# Patient Record
Sex: Female | Born: 1960 | Race: Black or African American | Hispanic: No | Marital: Single | Smoking: Former smoker
Health system: Southern US, Community
[De-identification: ages and names within clinical notes are randomized; demographics above are authoritative.]

## PROBLEM LIST (undated history)

## (undated) DIAGNOSIS — R0902 Hypoxemia: Secondary | ICD-10-CM

## (undated) DIAGNOSIS — E669 Obesity, unspecified: Secondary | ICD-10-CM

## (undated) DIAGNOSIS — E785 Hyperlipidemia, unspecified: Secondary | ICD-10-CM

## (undated) DIAGNOSIS — E559 Vitamin D deficiency, unspecified: Secondary | ICD-10-CM

## (undated) DIAGNOSIS — I10 Essential (primary) hypertension: Secondary | ICD-10-CM

## (undated) HISTORY — PX: ABDOMINAL HYSTERECTOMY: SHX81

## (undated) HISTORY — DX: Hypoxemia: R09.02

## (undated) HISTORY — DX: Obesity, unspecified: E66.9

## (undated) HISTORY — DX: Hyperlipidemia, unspecified: E78.5

## (undated) HISTORY — DX: Vitamin D deficiency, unspecified: E55.9

---

## 2000-12-21 HISTORY — PX: MYOMECTOMY: SHX85

## 2003-12-22 HISTORY — PX: ABDOMINAL HYSTERECTOMY: SHX81

## 2006-12-10 ENCOUNTER — Encounter: Admission: RE | Admit: 2006-12-10 | Discharge: 2006-12-10 | Payer: Self-pay | Admitting: Family Medicine

## 2007-06-07 ENCOUNTER — Other Ambulatory Visit: Admission: RE | Admit: 2007-06-07 | Discharge: 2007-06-07 | Payer: Self-pay | Admitting: Family Medicine

## 2008-06-11 ENCOUNTER — Inpatient Hospital Stay (HOSPITAL_COMMUNITY): Admission: RE | Admit: 2008-06-11 | Discharge: 2008-06-13 | Payer: Self-pay | Admitting: Obstetrics & Gynecology

## 2008-06-11 ENCOUNTER — Encounter (INDEPENDENT_AMBULATORY_CARE_PROVIDER_SITE_OTHER): Payer: Self-pay | Admitting: Obstetrics & Gynecology

## 2009-06-17 ENCOUNTER — Encounter: Admission: RE | Admit: 2009-06-17 | Discharge: 2009-06-17 | Payer: Self-pay | Admitting: Family Medicine

## 2011-02-27 ENCOUNTER — Other Ambulatory Visit: Payer: Self-pay | Admitting: Gastroenterology

## 2011-05-05 NOTE — H&P (Signed)
[name], [name]              ACCOUNT NO.:  [id number]   MEDICAL RECORD NO.:  [id number]          PATIENT TYPE:  AMB   LOCATION:  SDC                           FACILITY:  [facility]   PHYSICIAN:  Freddy Finner, M.D.   DATE OF BIRTH:  07-May-1961   DATE OF ADMISSION:  DATE OF DISCHARGE:                              HISTORY & PHYSICAL   ADMISSION DIAGNOSES:  1. Uterine leiomyomata.  2. Cystic left adnexal mass.   HISTORY OF PRESENT ILLNESS:  The patient is a 49 year old female,  gravida 0, who has a long history of uterine leiomyomata and in 2003 had  a myomectomy.  Subsequent to that she had a normal hysterosalpingogram  during the process of an infertility evaluation and treatment during her  previous marriage.  She presented first to my office in October of 2007,  at which time the uterus was felt to be upper normal to bimanual  examination and she had numerous symptoms including intermittent pelvic  pain, premenstrual mood changes.  She had blood testing at that time  which revealed normal FSH and normal TSH, precluding the diagnosis of  menopause.  Since that interval of time she has been followed on several  other occasions during which she had complained of dysfunctional  bleeding with two periods a month.  She has been tried on oral  contraceptives to suppress the bleeding.  More conservative managements  have been discussed with her including NovaSure endometrial ablation,  intrauterine device, laparoscopy for pain and evaluation of the cystic  left adnexal mass.  She is requesting definitive surgical intervention  and is admitted now for total abdominal hysterectomy/bilateral salpingo-  oophorectomy.  This choice of surgical route is based on previous  abdominal surgery and nulliparous genital canal.   REVIEW OF SYSTEMS:  Her current review of systems is otherwise negative  and no specific cardiopulmonary etiology or new complaints.   FAMILY HISTORY:  Remarkable for uterine  cancer in her mother.  She had a  grandfather with colon cancer.   PAST MEDICAL HISTORY:  1. The patient had a diagnosis of hypertension made in 2005.  At the      present time she takes Norvasc 5 mg a day for that condition.  2. 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  She has no other significant medical illnesses.   SOCIAL HISTORY:  She does smoke one pack per day of cigarettes and has  moderate alcohol intake.   PAST SURGICAL HISTORY:  Other than the myomectomy noted above, include:  Left breast biopsy which was benign.   PHYSICAL EXAMINATION:  HEENT:  Grossly  within normal limits.  NECK:  Thyroid gland is not palpably enlarged.  VITAL SIGNS:  Blood pressure in the office was 120/70.  Her weight is  180, height 5 feet 7 inches.  GENERAL:  The patient is alert, oriented.  CHEST:  Clear to auscultation.  HEART:  Normal sinus rhythm without murmurs, rubs, or gallops.  BREASTS:  Breast exam is considered to be normal.  No palpable  mass is  noted on difficult discharge for skin change.  (Normal mammogram was  obtained in our office in July of 2008.)  ABDOMEN:  Soft and nontender without appreciable organomegaly or  palpable masses.  EXTREMITIES:  Without cyanosis, clubbing, or edema.  PELVIC:  External genitalia, vagina, and cervix are normal.  The patient  has minimal uterine descent consistent with her nulliparous state.  Bimanual reveals the uterus to be approximately 10 weeks' gestational  size and irregularly nodular.  Adnexa:  There are no palpable adnexal  masses.  RECTAL:  Rectum is found to be normal.  Rectovaginal exam confirms the  above findings.   LABORATORY DATA:  Recent ultrasound in the office in April of 2009  showed multiple uterine leiomyomata with the largest measuring 2.6 cm.  There was a cylindrical 4.8 x 3.9 x 4.2 cm irregular mass lateral and  superior to the left ovary.   ASSESSMENT:  1. Uterine leiomyomata.   2. Surgical history of myomectomy.  3. Suspected hydrosalpinx of left adnexa.  Nulliparous, large      __________ canal.  4. History of previous abdominal surgery.   PLAN:  Total abdominal hysterectomy/bilateral salpingo-oophorectomy.  The patient has been counseled on the potential risks of the procedure  including bleeding, entry to other organs, in particular, bowel and  bladder, or vascular injury.  Other risks of surgery including infection  and postoperative hemorrhage and deep vein thrombosis have been  discussed as well as the prophylactic measures which will be instituted  to reduce these risks.  The patient is now admitted and prepared to  proceed with surgery.      Freddy Finner, M.D.  Electronically Signed     WRN/MEDQ  D:  06/09/2008  T:  06/10/2008  Job:  [id number]

## 2011-05-05 NOTE — Op Note (Signed)
[name], [name]              ACCOUNT NO.:  [id number]   MEDICAL RECORD NO.:  [id number]          PATIENT TYPE:  INP   LOCATION:  9302                          FACILITY:  [facility]   PHYSICIAN:  [name], M.D.   DATE OF BIRTH:  06-Feb-1961   DATE OF PROCEDURE:  06/11/2008  DATE OF DISCHARGE:                               OPERATIVE REPORT   PREOPERATIVE DIAGNOSES:  1. Uterine fibroids.  2. Dysfunctional uterine bleeding.  3. History of myomectomy.  4. Nulliparous patient.   POSTOPERATIVE DIAGNOSES:  1. Uterine fibroids.  2. Dysfunctional uterine bleeding.  3. History of myomectomy.  4. Nulliparous patient.  5. Dense pelvic adhesions of ileum to posterior fundus and left      adnexa.  6. Hydrosalpinx of the left fallopian tube.   OPERATIVE PROCEDURES:  1. Total abdominal hysterectomy, bilateral salpingo-oophorectomy.  2. Lysis of pelvic and abdominal adhesions.   SURGEON:  Lori Finner, MD   ASSISTANT:  Zelphia Cairo, MD   ANESTHESIA:  General endotracheal.   ESTIMATED INTRAOPERATIVE BLOOD LOSS:  100 mL.   INTRAOPERATIVE COMPLICATIONS:  None.   DETAILS OF PRESENT ILLNESS:  Recorded in the admission note.   The patient was admitted on the morning of surgery, a 1-g bolus of Ancef  was given IV.  She was placed in PAS hose.  She was brought to the  operating room, there placed under adequate general endotracheal  anesthesia, placed in dorsal recumbent position.  Abdomen, perineum and  vagina were prepped and draped in the usual fashion and Foley catheter  was placed using sterile technique.  A lower abdominal transverse  incision was made through an old lower abdominal transverse scar.  The  incision was carried sharply to fascia with sharp dissection.  Fascia  was entered sharply and extended to the extent of the skin incision.  Rectus sheath was developed superiorly and inferiorly with blunt and  sharp dissection.  Rectus muscles divided in the midline.   Peritoneum  was identified and entered sharply and extended bluntly to the extent of  the skin incision.  Exploration of the upper abdomen manually revealed  no apparent abnormalities of the liver or kidneys, no palpable other  abnormalities were noted.  The appendix was visualized and was normal.  There was a filmy adhesion in the right upper quadrant below the  costophrenic margin, which was left in place.  A self-retaining O'Connor-  O'Sullivan retractor was placed.  Moist packs were used to pack the  intestinal contents out of the pelvis.  Adhesions to the left adnexa  were carefully dissected to free the ileum from the posterior fundus  just inferior to the utero-ovarian ligament.  This was also adherent to  a fluid-filled fallopian tube on the left.  This too was easily freed.  There were filmy adhesions of the fimbriae to the cornu of the uterus on  the left but none into the deep pelvis or onto to the bowel.  Proximal  broad ligaments were then grasped with Tresa Endo.  Infundibulopelvic  ligament on the left was sealed and divided using the LigaSure  system.  This dissection was carried inferiorly to a level of the left uterine  artery.  Right side was then treated essentially identically.  Bladder  flap was released.  The vessel pedicle was sealed with the LigaSure and  divided.  The bladder was very carefully advanced off the cervix and  lower segment.  Cardinal ligament pedicles were sealed and divided using  LigaSure and a scalpel.  Uterosacrals were taken with a Heaney, divided  sharply and ligated with suture ligature of 0 Monocryl.  Angles of the  vagina were then taken with parametrium clamps, divided sharply and  ligated with 0 Monocryl suture in a Heaney fashion.  Cervix was  completely excised from the cuff.  Cuff was closed with figure-of-eights  of 0 Monocryl.  The uterosacrals were plicated using a single  interrupted 0 Monocryl suture.  Irrigation was carried out.   Hemostasis  was complete.  All pack, needles and instruments were removed.  Counts  were correct.  The abdominal incision was then closed in layers with  running 0 Monocryl to close the peritoneum and reapproximate the rectus  muscles.  Fascia was closed with looped 0 PDS running from angle to  angle on either side.  Subcutaneous tissue was approximated with a  running 2-0 plain.  Skin was closed with wide skin staples and quarter-  inch Steri-Strips.  The patient tolerated the operative procedure well.  She was awakened, taken to recovery in good condition.      [name], M.D.  Electronically Signed     WRN/MEDQ  D:  06/11/2008  T:  06/11/2008  Job:  [id number]

## 2011-05-08 NOTE — Discharge Summary (Signed)
[name], [name]              ACCOUNT NO.:  [id number]   MEDICAL RECORD NO.:  [id number]          PATIENT TYPE:  INP   LOCATION:  9302                          FACILITY:  [facility]   PHYSICIAN:  Freddy Finner, M.D.   DATE OF BIRTH:  1961-10-08   DATE OF ADMISSION:  06/11/2008  DATE OF DISCHARGE:  06/13/2008                               DISCHARGE SUMMARY   DISCHARGE DIAGNOSES:  Uterine leiomyomata, left hydrosalpinx with  significant pelvic adhesions, particularly on the left side and  adhesions from bowel to uterus.   OPERATIVE PROCEDURE:  Total abdominal hysterectomy, lysis of pelvic  adhesions, and bilateral salpingo-oophorectomy.   INTRAOPERATIVE COMPLICATIONS:  None.   POSTOPERATIVE COMPLICATIONS:  None.   DISPOSITION:  The patient was in satisfactory improved condition on the  second postoperative day.  She was ambulating without assistance.  She  was tolerating a regular diet.  Her incision was healing well and was  intact.  She had remained afebrile throughout her hospital stay.  She  was discharged home to resume any and all preoperative medications.  She  was given Vivelle-Dot 0.1 to use for hormone replacement therapy, to be  used 1 knee patch every 3-4 days.  She was given Percocet 5/325 to be  taken 1-2 every 4 hours as needed for postoperative pain.  She is to  return to the office in approximately 2 weeks for postoperative visit.  She is to call for fever, heavy vaginal bleeding or severe pain.   Details of present illness, past history, family history, review of  systems and physical exam recorded in the admission note.  Physical  findings were primarily remarkable for the pelvic findings, which were  most definitively outlined by ultrasound, which showed numerous uterine  leiomyomata and linear cystic structure in the left adnexa thought most  likely to be hydrosalpinx.  Her clinical symptoms were noted in the  present illness.   LABORATORY DATA:  During this  admission includes histology review of the  surgically removed tissue with benign findings including adhesions and  leiomyomata.  Her preoperative CBC was normal with a hemoglobin of 12.8.  Her postoperative hemoglobin was 12.4.  Her admission prothrombin time,  PTT and urinalysis were all normal.   HOSPITAL COURSE:  The patient was admitted on the morning of surgery.  She was treated perioperatively with an IV antibiotic and with PAS  compression hose on her lower extremities.  She was taken to the  operating room where the above-described operative procedure was  accomplished without significant complications intraoperatively.  Extensive lysis of adhesions was required, which was carefully done with  no injury to other  structures.  Postoperatively, she remained afebrile throughout her  hospital stay.  Her progress was considered to be entirely satisfactory.  By the morning of the second postoperative day, her condition was  considered to be good and she was discharged home with disposition as  noted above.      Freddy Finner, M.D.  Electronically Signed     WRN/MEDQ  D:  06/30/2008  T:  06/30/2008  Job:  [id number]

## 2011-05-12 ENCOUNTER — Other Ambulatory Visit: Payer: Self-pay | Admitting: Internal Medicine

## 2011-05-12 DIAGNOSIS — Z1231 Encounter for screening mammogram for malignant neoplasm of breast: Secondary | ICD-10-CM

## 2011-06-26 ENCOUNTER — Ambulatory Visit
Admission: RE | Admit: 2011-06-26 | Discharge: 2011-06-26 | Disposition: A | Discharge status: Home / Self Care | Payer: Self-pay | Source: Ambulatory Visit | Attending: Internal Medicine | Admitting: Internal Medicine

## 2011-06-26 ENCOUNTER — Ambulatory Visit: Payer: Self-pay

## 2011-06-26 DIAGNOSIS — Z1231 Encounter for screening mammogram for malignant neoplasm of breast: Secondary | ICD-10-CM

## 2011-06-29 ENCOUNTER — Other Ambulatory Visit: Payer: Self-pay | Admitting: Obstetrics and Gynecology

## 2011-06-29 DIAGNOSIS — N644 Mastodynia: Secondary | ICD-10-CM

## 2011-07-01 ENCOUNTER — Other Ambulatory Visit: Payer: Self-pay | Admitting: Obstetrics and Gynecology

## 2011-07-01 ENCOUNTER — Ambulatory Visit
Admission: RE | Admit: 2011-07-01 | Discharge: 2011-07-01 | Disposition: A | Discharge status: Home / Self Care | Payer: BC Managed Care – PPO | Source: Ambulatory Visit | Attending: Obstetrics and Gynecology | Admitting: Obstetrics and Gynecology

## 2011-07-01 DIAGNOSIS — N644 Mastodynia: Secondary | ICD-10-CM

## 2011-09-17 LAB — CBC
HCT: 37
HCT: 38
Hemoglobin: 12.4
Hemoglobin: 12.8
MCHC: 33.6
MCHC: 33.8
MCV: 82.3
MCV: 82.9
Platelets: 262
Platelets: 278
RBC: 4.46
RBC: 4.61
RDW: 12.3
RDW: 12.4
WBC: 11.1 — ABNORMAL HIGH
WBC: 4.9

## 2011-09-17 LAB — URINALYSIS, ROUTINE W REFLEX MICROSCOPIC
Bilirubin Urine: NEGATIVE
Glucose, UA: NEGATIVE
Ketones, ur: NEGATIVE
Leukocytes, UA: NEGATIVE
Nitrite: NEGATIVE
Protein, ur: NEGATIVE
Specific Gravity, Urine: 1.015
Urobilinogen, UA: 0.2
pH: 5.5

## 2011-09-17 LAB — URINE MICROSCOPIC-ADD ON

## 2011-09-17 LAB — PROTIME-INR
INR: 1
Prothrombin Time: 13.2

## 2011-09-17 LAB — APTT: aPTT: 32

## 2012-04-18 ENCOUNTER — Emergency Department (HOSPITAL_COMMUNITY)
Admission: EM | Admit: 2012-04-18 | Discharge: 2012-04-18 | Disposition: A | Discharge status: Home / Self Care | Payer: BC Managed Care – PPO | Attending: Emergency Medicine | Admitting: Emergency Medicine

## 2012-04-18 ENCOUNTER — Emergency Department (HOSPITAL_COMMUNITY): Payer: BC Managed Care – PPO

## 2012-04-18 ENCOUNTER — Encounter (HOSPITAL_COMMUNITY): Payer: Self-pay | Admitting: Emergency Medicine

## 2012-04-18 DIAGNOSIS — R51 Headache: Secondary | ICD-10-CM | POA: Insufficient documentation

## 2012-04-18 DIAGNOSIS — R079 Chest pain, unspecified: Secondary | ICD-10-CM | POA: Insufficient documentation

## 2012-04-18 DIAGNOSIS — R42 Dizziness and giddiness: Secondary | ICD-10-CM

## 2012-04-18 DIAGNOSIS — I10 Essential (primary) hypertension: Secondary | ICD-10-CM | POA: Insufficient documentation

## 2012-04-18 DIAGNOSIS — E119 Type 2 diabetes mellitus without complications: Secondary | ICD-10-CM | POA: Insufficient documentation

## 2012-04-18 DIAGNOSIS — Z7982 Long term (current) use of aspirin: Secondary | ICD-10-CM | POA: Insufficient documentation

## 2012-04-18 DIAGNOSIS — Z79899 Other long term (current) drug therapy: Secondary | ICD-10-CM | POA: Insufficient documentation

## 2012-04-18 HISTORY — DX: Essential (primary) hypertension: I10

## 2012-04-18 LAB — POCT I-STAT TROPONIN I: Troponin i, poc: 0.01 ng/mL (ref 0.00–0.08)

## 2012-04-18 LAB — CBC
HCT: 40.2 % (ref 36.0–46.0)
Hemoglobin: 13.4 g/dL (ref 12.0–15.0)
MCH: 27 pg (ref 26.0–34.0)
MCHC: 33.3 g/dL (ref 30.0–36.0)
MCV: 81 fL (ref 78.0–100.0)
Platelets: 218 10*3/uL (ref 150–400)
RBC: 4.96 MIL/uL (ref 3.87–5.11)
RDW: 13.8 % (ref 11.5–15.5)
WBC: 5 10*3/uL (ref 4.0–10.5)

## 2012-04-18 LAB — DIFFERENTIAL
Basophils Absolute: 0 10*3/uL (ref 0.0–0.1)
Basophils Relative: 1 % (ref 0–1)
Eosinophils Absolute: 0.1 10*3/uL (ref 0.0–0.7)
Eosinophils Relative: 2 % (ref 0–5)
Lymphocytes Relative: 41 % (ref 12–46)
Lymphs Abs: 2 10*3/uL (ref 0.7–4.0)
Monocytes Absolute: 0.4 10*3/uL (ref 0.1–1.0)
Monocytes Relative: 8 % (ref 3–12)
Neutro Abs: 2.5 10*3/uL (ref 1.7–7.7)
Neutrophils Relative %: 50 % (ref 43–77)

## 2012-04-18 LAB — COMPREHENSIVE METABOLIC PANEL
ALT: 38 U/L — ABNORMAL HIGH (ref 0–35)
AST: 33 U/L (ref 0–37)
Albumin: 4.4 g/dL (ref 3.5–5.2)
Alkaline Phosphatase: 92 U/L (ref 39–117)
BUN: 14 mg/dL (ref 6–23)
CO2: 25 mEq/L (ref 19–32)
Calcium: 9.8 mg/dL (ref 8.4–10.5)
Chloride: 103 mEq/L (ref 96–112)
Creatinine, Ser: 0.91 mg/dL (ref 0.50–1.10)
GFR calc Af Amer: 83 mL/min — ABNORMAL LOW (ref >90)
GFR calc non Af Amer: 72 mL/min — ABNORMAL LOW (ref >90)
Glucose, Bld: 88 mg/dL (ref 70–99)
Potassium: 4.1 mEq/L (ref 3.5–5.1)
Sodium: 139 mEq/L (ref 135–145)
Total Bilirubin: 0.4 mg/dL (ref 0.3–1.2)
Total Protein: 7.4 g/dL (ref 6.0–8.3)

## 2012-04-18 LAB — GLUCOSE, CAPILLARY: Glucose-Capillary: 97 mg/dL (ref 70–99)

## 2012-04-18 LAB — D-DIMER, QUANTITATIVE: D-Dimer, Quant: 0.23 ug/mL-FEU (ref 0.00–0.48)

## 2012-04-18 MED ORDER — GADOBENATE DIMEGLUMINE 529 MG/ML IV SOLN
17.0000 mL | Freq: Once | INTRAVENOUS | Status: AC | PRN
  Administered 2012-04-18: 17 mL via INTRAVENOUS

## 2012-04-18 MED ORDER — AMLODIPINE BESYLATE 5 MG PO TABS
5.0000 mg | ORAL_TABLET | Freq: Once | ORAL | Status: AC
  Administered 2012-04-18: 5 mg via ORAL
  Filled 2012-04-18 (×3): qty 1

## 2012-04-18 MED ORDER — MECLIZINE HCL 50 MG PO TABS: 50.0000 mg | ORAL_TABLET | Freq: Three times a day (TID) | ORAL | Status: AC | PRN

## 2012-04-18 MED ORDER — LORAZEPAM 1 MG PO TABS
1.0000 mg | ORAL_TABLET | Freq: Once | ORAL | Status: AC
  Administered 2012-04-18: 1 mg via ORAL
  Filled 2012-04-18: qty 1

## 2012-04-18 MED ORDER — ASPIRIN 81 MG PO CHEW
324.0000 mg | CHEWABLE_TABLET | Freq: Once | ORAL | Status: AC
  Administered 2012-04-18: 324 mg via ORAL
  Filled 2012-04-18: qty 4

## 2012-04-18 NOTE — ED Notes (Signed)
PA at bedside.

## 2012-04-18 NOTE — ED Notes (Signed)
Pt reports having a dizzy/lightheaded episode at work at 8 am this morning. NO LOC or fall. Pt reports recent changes in diabetic meds and BP meds. Pt does not c/o of any numbness or tingling at this time.

## 2012-04-18 NOTE — Discharge Instructions (Signed)
You have been evaluated in the emergency department for dizziness. The blood tests, EKG, and chest x-ray did not show signs of a heart attack or blood clot in your lungs. Your head CT and MRI did not show signs of a stroke. Please call your primary care Dr. to make a followup appointment to discuss your blood pressure medication and see if it needs to be adjusted. Do not check your blood pressure more than once a day. Consider making a followup appointment with cardiology. If you develop worsening chest pain, shortness of breath, dizziness, facial droop or trouble speaking or walking or any other worrisome symptoms, please return to the emergency department.  Dizziness Dizziness is a common problem. It is a feeling of unsteadiness or lightheadedness. You may feel like you are about to faint. Dizziness can lead to injury if you stumble or fall. A person of any age group can suffer from dizziness, but dizziness is more common in older adults. CAUSES  Dizziness can be caused by many different things, including:  Middle ear problems.   Standing for too long.   Infections.   An allergic reaction.   Aging.   An emotional response to something, such as the sight of blood.   Side effects of medicines.   Fatigue.   Problems with circulation or blood pressure.   Excess use of alcohol, medicines, or illegal drug use.   Breathing too fast (hyperventilation).   An arrhythmia or problems with your heart rhythm.   Low red blood cell count (anemia).   Pregnancy.   Vomiting, diarrhea, fever, or other illnesses that cause dehydration.   Diseases or conditions such as Parkinson's disease, high blood pressure (hypertension), diabetes, and thyroid problems.   Exposure to extreme heat.  DIAGNOSIS  To find the cause of your dizziness, your caregiver may do a physical exam, lab tests, radiologic imaging scans, or an electrocardiography test (ECG).  TREATMENT  Treatment of dizziness depends on the  cause of your symptoms and can vary greatly. HOME CARE INSTRUCTIONS   Drink enough fluids to keep your urine clear or pale yellow. This is especially important in very hot weather. In the elderly, it is also important in cold weather.   If your dizziness is caused by medicines, take them exactly as directed. When taking blood pressure medicines, it is especially important to get up slowly.   Rise slowly from chairs and steady yourself until you feel okay.   In the morning, first sit up on the side of the bed. When this seems okay, stand slowly while holding onto something until you know your balance is fine.   If you need to stand in one place for a long time, be sure to move your legs often. Tighten and relax the muscles in your legs while standing.   If dizziness continues to be a problem, have someone stay with you for a day or two. Do this until you feel you are well enough to stay alone. Have the person call your caregiver if he or she notices changes in you that are concerning.   Do not drive or use heavy machinery if you feel dizzy.  SEEK IMMEDIATE MEDICAL CARE IF:   Your dizziness or lightheadedness gets worse.   You feel nauseous or vomit.   You develop problems with talking, walking, weakness, or using your arms, hands, or legs.   You are not thinking clearly or you have difficulty forming sentences. It may take a friend or family member  to determine if your thinking is normal.   You develop chest pain, abdominal pain, shortness of breath, or sweating.   Your vision changes.   You notice any bleeding.   You have side effects from medicine that seems to be getting worse rather than better.  MAKE SURE YOU:   Understand these instructions.   Will watch your condition.   Will get help right away if you are not doing well or get worse.  Document Released: 06/02/2001 Document Revised: 11/26/2011 Document Reviewed: 06/26/2011 [facility] Patient Information 2012 [place],  [place].

## 2012-04-18 NOTE — ED Notes (Signed)
Patient transported to CT 

## 2012-04-18 NOTE — ED Notes (Signed)
Pt reports change in vision for past 3-4 months, states vision is blurry and has appt to check. Normally wears glasses.

## 2012-04-18 NOTE — ED Notes (Signed)
Pt states dizziness worse when turning head quickly. Pt denies nausea and CP at this time. Pt does not normally check CBG.

## 2012-04-18 NOTE — ED Notes (Signed)
Pt also reports dull headaches that have been intermittant for several days and she also reports "I feel like my heart races at night."

## 2012-04-18 NOTE — ED Provider Notes (Signed)
History     CSN: [id number]  Arrival date & time 04/18/12  1035   First MD Initiated Contact with Patient 04/18/12 1151      No chief complaint on file.   (Consider location/radiation/quality/duration/timing/severity/associated sxs/prior treatment) HPI Hx from pt. 51yo F who presents with dizziness, headache, and intermittent chest pain. The dizziness is her chief reason for presentation today. She states that this has been intermittent for "some time", at least for the past month, but she had an episode while at work this morning which she states was worse than previous. Dizziness is described as "lightheadedness" and is not vertiginous in nature. She has no associated tinnitus, loss of hearing, nausea, vomiting. She has not had any associated visual changes, facial droop, trouble speaking or walking. There are no known aggravating or alleviating factors.  Patient also states that she has had intermittent chest pain for at least the last month. This is described as sharp and stabbing in nature and located to the left chest. It does not worsen with exertion, is not pleuritic in nature, it is not positional in nature. There no known aggravating factors. It is very brief when it does come on, lasting only a few seconds at a time. She has no associated diaphoresis, shortness of breath, nausea, vomiting. Denies palpitations.  Patient also complains of intermittent frontal headaches for the past 2 weeks. Pain is dull and achy. No known aggravating or alleviating factors. No treatment prior to arrival. No hx migraine.  Of note, pt is a smoker and on estradiol. Patient has no prior history of DVT/PE. Denies recent trauma, surgery, or prolonged immobilization. Denies hemoptysis, leg swelling.  Past Medical History  Diagnosis Date  . Diabetes mellitus   . Hypertension     No past surgical history on file.  No family history on file.  History  Substance Use Topics  . Smoking status: Never  Smoker   . Smokeless tobacco: Not on file  . Alcohol Use: No    OB History    Grav Para Term Preterm Abortions TAB SAB Ect Mult Living                  Review of Systems  Constitutional: Negative for fever, chills, activity change and appetite change.  Respiratory: Negative for cough, chest tightness and shortness of breath.   Cardiovascular: Positive for chest pain. Negative for palpitations and leg swelling.  Gastrointestinal: Negative for nausea, vomiting and abdominal pain.  Musculoskeletal: Negative for myalgias.  Skin: Negative for color change and rash.  Neurological: Positive for light-headedness. Negative for seizures, syncope, facial asymmetry, speech difficulty and numbness.  Psychiatric/Behavioral: Negative for confusion.  All other systems reviewed and are negative.    Allergies  Review of patient's allergies indicates no known allergies.  Home Medications   Current Outpatient Rx  Name Route Sig Dispense Refill  . ASPIRIN EC 81 MG PO TBEC Oral Take 81 mg by mouth daily.    . ATORVASTATIN CALCIUM 20 MG PO TABS Oral Take 20 mg by mouth daily.    Marland Kitchen VITAMIN D 1000 UNITS PO TABS Oral Take 1,000 Units by mouth daily.    . CO Q 10 PO Oral Take 1 capsule by mouth daily.    Marland Kitchen ESTRADIOL 0.05 MG/24HR TD PTTW Transdermal Place 1 patch onto the skin once a week. Swapped out on Tuesdays.    . OMEGA-3 FATTY ACIDS 1000 MG PO CAPS Oral Take 1 g by mouth daily.    Marland Kitchen  LOSARTAN POTASSIUM 100 MG PO TABS Oral Take 100 mg by mouth daily.    . ADULT MULTIVITAMIN W/MINERALS CH Oral Take 1 tablet by mouth daily.    Marland Kitchen SAXAGLIPTIN HCL 5 MG PO TABS Oral Take 2.5 mg by mouth daily.      BP 187/93  Pulse 60  Temp(Src) 98.2 F (36.8 C) (Oral)  Resp 20  SpO2 100%  Physical Exam  Nursing note and vitals reviewed. Constitutional: She is oriented to person, place, and time. She appears well-developed and well-nourished. No distress.  HENT:  Head: Normocephalic and atraumatic.  Right  Ear: External ear normal.  Left Ear: External ear normal.  Mouth/Throat: Oropharynx is clear and moist. No oropharyngeal exudate.  Eyes: EOM are normal. Pupils are equal, round, and reactive to light.  Neck: Normal range of motion. Neck supple. Carotid bruit is not present.  Cardiovascular: Normal rate, regular rhythm and normal heart sounds.  Exam reveals no gallop and no friction rub.   No murmur heard. Pulmonary/Chest: Effort normal and breath sounds normal. She exhibits no tenderness.  Abdominal: Soft. Bowel sounds are normal. There is no tenderness. There is no rebound and no guarding.  Musculoskeletal: Normal range of motion.  Lymphadenopathy:    She has no cervical adenopathy.  Neurological: She is alert and oriented to person, place, and time. She has normal strength. No cranial nerve deficit or sensory deficit. Coordination normal. GCS eye subscore is 4. GCS verbal subscore is 5. GCS motor subscore is 6.  Skin: Skin is warm and dry. She is not diaphoretic.  Psychiatric: She has a normal mood and affect.    ED Course  Procedures (including critical care time)   Date: 04/18/2012  Rate: 59  Rhythm: normal sinus rhythm  QRS Axis: normal  Intervals: normal  ST/T Wave abnormalities: nonspecific ST changes in V1 and V2  Conduction Disutrbances:left anterior fascicular block and RSR' in V1 and V2  Narrative Interpretation:   Old EKG Reviewed: none available    Labs Reviewed  COMPREHENSIVE METABOLIC PANEL - Abnormal; Notable for the following:    ALT 38 (*)    GFR calc non Af Amer 72 (*)    GFR calc Af Amer 83 (*)    All other components within normal limits  GLUCOSE, CAPILLARY  CBC  DIFFERENTIAL  D-DIMER, QUANTITATIVE  POCT I-STAT TROPONIN I   Ct Head Wo Contrast  04/18/2012  *RADIOLOGY REPORT*  Clinical Data: Dizziness.  CT HEAD WITHOUT CONTRAST  Technique:  Contiguous axial images were obtained from the base of the skull through the vertex without contrast.   Comparison: No priors.  Findings: No acute intracranial abnormalities.  Specifically, no definite evidence of acute/subacute cerebral ischemia, no focal mass, mass effect, hydrocephalus or abnormal intra or extra-axial fluid collections.  Visualized paranasal sinuses and mastoids are well pneumatized.  No acute displaced skull fractures are identified.  IMPRESSION:  1.  No acute intracranial abnormalities. 2.  The appearance of the brain is normal.  Original Report Authenticated By: Florencia Reasons, M.D.   Mr Laqueta Jean Wo Contrast  04/18/2012  *RADIOLOGY REPORT*  Clinical Data: 51 year old female with new dizziness.  Episodes of unilateral weakness on the right and word finding difficulty.  MRI HEAD WITHOUT AND WITH CONTRAST  Technique:  Multiplanar, multiecho pulse sequences of the brain and surrounding structures were obtained according to standard protocol without and with intravenous contrast  Contrast: 17mL MULTIHANCE GADOBENATE DIMEGLUMINE 529 MG/ML IV SOLN  Comparison: Head CT without contrast  04/18/2012.  Findings: No restricted diffusion to suggest acute infarction.  No midline shift, mass effect, evidence of mass lesion, ventriculomegaly, extra-axial collection or acute intracranial hemorrhage.  Cervicomedullary junction and pituitary are within normal limits.  Major intracranial vascular flow voids are preserved; dominant left vertebral artery is suspected.   Wallace Cullens and white matter signal is within normal limits throughout the brain. Negative visualized internal auditory structures.  Incidental asymmetric normal bone marrow signal at the left petrous apex. Negative visualized cervical spine except for degenerative vertebral endplate changes. Visualized bone marrow signal is within normal limits. No abnormal enhancement identified.  Visualized orbit soft tissues are within normal limits.  Minimal paranasal sinus and mastoid fluid signal or mucosal thickening. Negative scalp soft tissues.  IMPRESSION:  Normal MRI appearance of the brain.  Original Report Authenticated By: Harley Hallmark, M.D.   Dg Chest Port 1 View  04/18/2012  *RADIOLOGY REPORT*  Clinical Data: Chest pain.  PORTABLE CHEST - 1 VIEW  Comparison: None.  Findings: The heart size is normal.  The lungs are clear.  The visualized soft tissues and bony thorax are unremarkable.  IMPRESSION: Negative chest.  Original Report Authenticated By: Jamesetta Orleans. MATTERN, M.D.     1. Lightheadedness       MDM  12:19 PM Pt assessed. EKG examined. D/W Dr. Rosalia Hammers. RSR' in V1 and V2 with possible, minimal ST elevation.  12:35 PM ECG d/w Dr. Eden Emms with cardiology - he does not believe this appears ischemic in nature.  3:30 PM Pt's labs, CT essentially unremarkable. 2nd troponin also negative. Pt expresses concern over her dizziness today. She does appear neurologically intact, and these sx have been present for over a month, so I have a lowered suspicion for stroke. D/W Dr. Rosalia Hammers who reassessed pt with me. Will proceed with MRI to r/o cerebellar infarct.  8:00 PM Pt's MRI has resulted and is negative. Findings d/w pt. She expresses concern as her BP has been elevated more than it usually is while in the dept. I explained that while it is important to follow up with her PCP to see if her blood pressure meds need to be adjusted, we do not typically treat BP in the ED. Her workup here does not support a diagnosis of CVA or ACS. I suggested that she may consider following up with cardiology to evaluate chest pain, although I have a lowered suspicion that this is cardiac in nature as it is reproducible on exam. Emphasized importance of prompt PCP f/u. Will provide with rx for Antivert. Strict return precautions discussed. Pt and sister verbalized understanding and agreed to plan.    Grant Fontana, [place] 04/18/12 2157

## 2012-04-18 NOTE — ED Notes (Signed)
Patient transported to MRI 

## 2012-04-21 NOTE — ED Provider Notes (Signed)
History/physical exam/procedure(s) were performed by non-physician practitioner and as supervising physician I was immediately available for consultation/collaboration. I have reviewed all notes and am in agreement with care and plan.   Edwina Grossberg S Juleen Sorrels, MD 04/21/12 1753 

## 2012-06-02 ENCOUNTER — Other Ambulatory Visit: Payer: Self-pay | Admitting: Internal Medicine

## 2012-06-02 ENCOUNTER — Other Ambulatory Visit: Payer: Self-pay | Admitting: Obstetrics and Gynecology

## 2012-06-02 DIAGNOSIS — Z1231 Encounter for screening mammogram for malignant neoplasm of breast: Secondary | ICD-10-CM

## 2012-07-01 ENCOUNTER — Ambulatory Visit: Payer: BC Managed Care – PPO

## 2012-07-05 ENCOUNTER — Ambulatory Visit
Admission: RE | Admit: 2012-07-05 | Discharge: 2012-07-05 | Disposition: A | Discharge status: Home / Self Care | Payer: BC Managed Care – PPO | Source: Ambulatory Visit | Attending: Internal Medicine | Admitting: Internal Medicine

## 2012-07-05 DIAGNOSIS — Z1231 Encounter for screening mammogram for malignant neoplasm of breast: Secondary | ICD-10-CM

## 2013-06-05 ENCOUNTER — Other Ambulatory Visit: Payer: Self-pay

## 2013-06-05 DIAGNOSIS — Z1231 Encounter for screening mammogram for malignant neoplasm of breast: Secondary | ICD-10-CM

## 2013-07-06 ENCOUNTER — Ambulatory Visit
Admission: RE | Admit: 2013-07-06 | Discharge: 2013-07-06 | Disposition: A | Discharge status: Home / Self Care | Payer: BC Managed Care – PPO | Source: Ambulatory Visit

## 2013-07-06 DIAGNOSIS — Z1231 Encounter for screening mammogram for malignant neoplasm of breast: Secondary | ICD-10-CM

## 2014-05-30 ENCOUNTER — Other Ambulatory Visit: Payer: Self-pay

## 2014-05-30 DIAGNOSIS — Z1231 Encounter for screening mammogram for malignant neoplasm of breast: Secondary | ICD-10-CM

## 2014-07-13 ENCOUNTER — Encounter (INDEPENDENT_AMBULATORY_CARE_PROVIDER_SITE_OTHER): Payer: Self-pay

## 2014-07-13 ENCOUNTER — Ambulatory Visit
Admission: RE | Admit: 2014-07-13 | Discharge: 2014-07-13 | Disposition: A | Discharge status: Home / Self Care | Payer: BC Managed Care – PPO | Source: Ambulatory Visit

## 2014-07-13 DIAGNOSIS — Z1231 Encounter for screening mammogram for malignant neoplasm of breast: Secondary | ICD-10-CM

## 2015-05-17 ENCOUNTER — Other Ambulatory Visit: Payer: Self-pay | Admitting: Family

## 2015-05-17 ENCOUNTER — Ambulatory Visit
Admission: RE | Admit: 2015-05-17 | Discharge: 2015-05-17 | Disposition: A | Discharge status: Home / Self Care | Payer: BC Managed Care – PPO | Source: Ambulatory Visit | Attending: Family | Admitting: Family

## 2015-05-17 ENCOUNTER — Other Ambulatory Visit: Payer: Self-pay

## 2015-05-17 DIAGNOSIS — R0902 Hypoxemia: Secondary | ICD-10-CM

## 2015-05-17 DIAGNOSIS — Z1231 Encounter for screening mammogram for malignant neoplasm of breast: Secondary | ICD-10-CM

## 2015-07-22 ENCOUNTER — Ambulatory Visit
Admission: RE | Admit: 2015-07-22 | Discharge: 2015-07-22 | Disposition: A | Discharge status: Home / Self Care | Payer: BC Managed Care – PPO | Source: Ambulatory Visit

## 2015-07-22 DIAGNOSIS — Z1231 Encounter for screening mammogram for malignant neoplasm of breast: Secondary | ICD-10-CM

## 2015-08-01 ENCOUNTER — Encounter: Payer: Self-pay | Admitting: Neurology

## 2015-08-01 ENCOUNTER — Ambulatory Visit (INDEPENDENT_AMBULATORY_CARE_PROVIDER_SITE_OTHER): Payer: BC Managed Care – PPO | Admitting: Neurology

## 2015-08-01 VITALS — BP 126/84 | HR 72 | Resp 16 | Ht 66.0 in | Wt 192.0 lb

## 2015-08-01 DIAGNOSIS — G2581 Restless legs syndrome: Secondary | ICD-10-CM | POA: Diagnosis not present

## 2015-08-01 DIAGNOSIS — E669 Obesity, unspecified: Secondary | ICD-10-CM | POA: Diagnosis not present

## 2015-08-01 DIAGNOSIS — Z720 Tobacco use: Secondary | ICD-10-CM

## 2015-08-01 DIAGNOSIS — G479 Sleep disorder, unspecified: Secondary | ICD-10-CM

## 2015-08-01 DIAGNOSIS — R351 Nocturia: Secondary | ICD-10-CM

## 2015-08-01 DIAGNOSIS — F17200 Nicotine dependence, unspecified, uncomplicated: Secondary | ICD-10-CM

## 2015-08-01 DIAGNOSIS — G4719 Other hypersomnia: Secondary | ICD-10-CM | POA: Diagnosis not present

## 2015-08-01 DIAGNOSIS — R0683 Snoring: Secondary | ICD-10-CM

## 2015-08-01 NOTE — Progress Notes (Signed)
Subjective:    Patient ID: Malky Rudzinski is a 54 y.o. female.  HPI     Huston Foley, MD, PhD [facility] [address], [address] [address] [place], [place] [address]  Dear Fredna Dow,   I saw your patient, Fatisha Rabalais, upon your kind request in my neurologic clinic today for initial consultation of her sleep disorder, in particular, concern for underlying obstructive sleep apnea. The patient is unaccompanied today. As you know, Ms. Beil is a 54 year old right-handed woman with an underlying medical history of hypertension, type 2 diabetes, hyperlipidemia, vitamin D deficiency and obesity, who reports snoring and excessive daytime somnolence. I reviewed your office note from 05/14/2015, which you kindly included. I reviewed her blood work which was done at your office on 05/14/2015: Vitamin D level was 39.8, TSH normal at 0.65 to, hemoglobin A1c was 6.0, total cholesterol 196, LDL 1:15, triglycerides 161, CBC without differential was unremarkable, CMP unremarkable. She had a chest x-ray on 05/17/2015: No acute cardiopulmonary disease. Stable mild cardiomegaly. 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 Currently her bedtime is around 1 AM and she sleeps until 10 AM typically. She does not always wake up rested. She snores and woken herself up with her snoring and a sore throat and dry mouth. She denies morning headaches. She is a restless sleeper and often ends up with a significantly disheveled bed. She sleeps typically alone. She lives alone. She has occasional size bed. She also has restless leg symptoms and that her legs bother her at night and she has to move them. Her Epworth sleepiness score is 8 out of 24, her fatigue score is 22 out of 63. She likes to watch TV in bed and often will fall asleep with the TV on. During her workweek when school restarts she typically goes to bed around 11 PM and her rise time is 6:30 AM. She has nocturia 2  on an average night. She has 2 sisters with a diagnosis of obstructive sleep apnea. Her weight fluctuates. She drinks approximately 1-1/2 cups of coffee daily. She drinks alcohol typically 3 times a week. She smokes occasionally.  Her Past Medical History Is Significant For: Past Medical History  Diagnosis Date  . Diabetes mellitus   . Hypertension   . Hypoxemia   . Obesity   . Hyperlipemia   . Vitamin D deficiency     Her Past Surgical History Is Significant For: Past Surgical History  Procedure Laterality Date  . Abdominal hysterectomy      Her Family History Is Significant For: Family History  Problem Relation Age of Onset  . Cancer Father     Her Social History Is Significant For: Social History   Social History  . Marital Status: Divorced    Spouse Name: N/A  . Number of Children: 0  . Years of Education: Masters   Social History Main Topics  . Smoking status: Current Every Day Smoker -- 1.00 packs/day  . Smokeless tobacco: Not on file  . Alcohol Use: 0.0 oz/week    0 Standard drinks or equivalent per week  . Drug Use: No  . Sexual Activity: Not on file   Other Topics Concern  . Not on file   Social History Narrative   Drinks about 1.5-2 cups of coffee a day     Her Allergies Are:  No Known Allergies:   Her Current Medications Are:  Outpatient Encounter Prescriptions as of 08/01/2015  Medication Sig  .  aspirin EC 81 MG tablet Take 81 mg by mouth daily.  Marland Kitchen atorvastatin (LIPITOR) 20 MG tablet Take 20 mg by mouth daily.  . cholecalciferol (VITAMIN D) 1000 UNITS tablet Take 1,000 Units by mouth daily.  . Coenzyme Q10 (CO Q 10 PO) Take 1 capsule by mouth daily.  . fish oil-omega-3 fatty acids 1000 MG capsule Take 1 g by mouth daily.  Marland Kitchen losartan (COZAAR) 100 MG tablet Take 100 mg by mouth daily.  . [DISCONTINUED] ONGLYZA 2.5 MG TABS tablet   . [DISCONTINUED] amLODipine (NORVASC) 5 MG tablet    No facility-administered encounter medications on file as of  08/01/2015.  :  Review of Systems:  Out of a complete 14 point review of systems, all are reviewed and negative with the exception of these symptoms as listed below:   Review of Systems  Endocrine:       Feeling hot, feeling cold   Musculoskeletal:       Joint pain   Neurological:       Restless legs, no trouble falling asleep, trouble staying asleep, snoring, witnessed apnea, some days wakes up feeling tired, occasional nap.   Psychiatric/Behavioral:       Not enough sleep     Objective:  Neurologic Exam  Physical Exam Physical Examination:   There were no vitals filed for this visit.  General Examination: The patient is a very pleasant 54 y.o. female in no acute distress. She appears well-developed and well-nourished and well groomed.   HEENT: Normocephalic, atraumatic, pupils are equal, round and reactive to light and accommodation. Funduscopic exam is normal with sharp disc margins noted. Extraocular tracking is good without limitation to gaze excursion or nystagmus noted. Normal smooth pursuit is noted. Hearing is grossly intact. Tympanic membranes are clear bilaterally. Face is symmetric with normal facial animation and normal facial sensation. Speech is clear with no dysarthria noted. There is no hypophonia. There is no lip, neck/head, jaw or voice tremor. Neck is supple with full range of passive and active motion. There are no carotid bruits on auscultation. Oropharynx exam reveals: mild mouth dryness, adequate dental hygiene and moderate airway crowding, due to narrow airway entry, elongated uvula and redundant soft palate. Mallampati is class II. Tongue protrudes centrally and palate elevates symmetrically. Tonsils are small bilaterally. Neck size is 16 inches.   Chest: Clear to auscultation without wheezing, rhonchi or crackles noted.  Heart: S1+S2+0, regular and normal without murmurs, rubs or gallops noted.   Abdomen: Soft, non-tender and non-distended with normal bowel  sounds appreciated on auscultation.  Extremities: There is no pitting edema in the distal lower extremities bilaterally. Pedal pulses are intact.  Skin: Warm and dry without trophic changes noted. There are no varicose veins.  Musculoskeletal: exam reveals no obvious joint deformities, tenderness or joint swelling or erythema.   Neurologically:  Mental status: The patient is awake, alert and oriented in all 4 spheres. Her immediate and remote memory, attention, language skills and fund of knowledge are appropriate. There is no evidence of aphasia, agnosia, apraxia or anomia. Speech is clear with normal prosody and enunciation. Thought process is linear. Mood is normal and affect is normal.  Cranial nerves II - XII are as described above under HEENT exam. In addition: shoulder shrug is normal with equal shoulder height noted. Motor exam: Normal bulk, strength and tone is noted. There is no drift, tremor or rebound. Romberg is negative. Reflexes are 2+ throughout. Babinski: Toes are flexor bilaterally. Fine motor skills and coordination:  intact with normal finger taps, normal hand movements, normal rapid alternating patting, normal foot taps and normal foot agility.  Cerebellar testing: No dysmetria or intention tremor on finger to nose testing. Heel to shin is unremarkable bilaterally. There is no truncal or gait ataxia.  Sensory exam: intact to light touch, pinprick, vibration, temperature sense in the upper and lower extremities.  Gait, station and balance: She stands easily. No veering to one side is noted. No leaning to one side is noted. Posture is age-appropriate and stance is narrow based. Gait shows normal stride length and normal pace. No problems turning are noted. She turns en bloc. Tandem walk is unremarkable. Intact toe and heel stance is noted.               Assessment and Plan:   In summary, Ashlinn Hemrick is a very pleasant 54 y.o.-year old female with an underlying medical history  of hypertension, type 2 diabetes, hyperlipidemia, vitamin D deficiency and obesity, who reports snoring and excessive daytime somnolence. In addition, she reports nocturia. She also has symptoms of restless leg syndrome. Her history, family history, and physical exam are indeed concerning for obstructive sleep apnea (OSA). I had a long chat with the patient about my findings and the diagnosis of OSA, its prognosis and treatment options. We talked about medical treatments, surgical interventions and non-pharmacological approaches. I explained in particular the risks and ramifications of untreated moderate to severe OSA, especially with respect to developing cardiovascular disease down the Road, including congestive heart failure, difficult to treat hypertension, cardiac arrhythmias, or stroke. Even type 2 diabetes has, in part, been linked to untreated OSA. Symptoms of untreated OSA include daytime sleepiness, memory problems, mood irritability and mood disorder such as depression and anxiety, lack of energy, as well as recurrent headaches, especially morning headaches. We talked about smoking cessation and trying to maintain a healthy lifestyle in general, as well as the importance of weight control. I encouraged the patient to eat healthy, exercise daily and keep well hydrated, to keep a scheduled bedtime and wake time routine, to not skip any meals and eat healthy snacks in between meals. I advised the patient not to drive when feeling sleepy. I recommended the following at this time: sleep study with potential positive airway pressure titration. (We will score hypopneas at 3% and split the sleep study into diagnostic and treatment portion, if the estimated. 2 hour AHI is >15/h).   I explained the sleep test procedure to the patient and also outlined possible surgical and non-surgical treatment options of OSA, including the use of a custom-made dental device (which would require a referral to a specialist  dentist or oral surgeon), upper airway surgical options, such as pillar implants, radiofrequency surgery, tongue base surgery, and UPPP (which would involve a referral to an ENT surgeon). Rarely, jaw surgery such as mandibular advancement may be considered.  I also explained the CPAP treatment option to the patient, who indicated that she would be willing to try CPAP if the need arises. I explained the importance of being compliant with PAP treatment, not only for insurance purposes but primarily to improve Her symptoms, and for the patient's long term health benefit, including to reduce Her cardiovascular risks. I answered all her questions today and the patient was in agreement. I would like to see her back after the sleep study is completed and encouraged her to call with any interim questions, concerns, problems or updates.   Thank you very much for allowing  me to participate in the care of this nice patient. If I can be of any further assistance to you please do not hesitate to call me at [phone].  Sincerely,   Star Age, MD, PhD

## 2015-08-01 NOTE — Patient Instructions (Signed)

## 2016-07-06 ENCOUNTER — Other Ambulatory Visit: Payer: Self-pay | Admitting: Internal Medicine

## 2016-07-06 DIAGNOSIS — Z1231 Encounter for screening mammogram for malignant neoplasm of breast: Secondary | ICD-10-CM

## 2016-07-10 ENCOUNTER — Other Ambulatory Visit: Payer: Self-pay | Admitting: Gastroenterology

## 2016-07-10 LAB — HM COLONOSCOPY

## 2016-07-22 ENCOUNTER — Ambulatory Visit
Admission: RE | Admit: 2016-07-22 | Discharge: 2016-07-22 | Disposition: A | Discharge status: Home / Self Care | Payer: BC Managed Care – PPO | Source: Ambulatory Visit | Attending: Internal Medicine | Admitting: Internal Medicine

## 2016-07-22 DIAGNOSIS — Z1231 Encounter for screening mammogram for malignant neoplasm of breast: Secondary | ICD-10-CM

## 2017-04-23 ENCOUNTER — Other Ambulatory Visit: Payer: Self-pay | Admitting: Internal Medicine

## 2017-04-23 ENCOUNTER — Ambulatory Visit
Admission: RE | Admit: 2017-04-23 | Discharge: 2017-04-23 | Disposition: A | Discharge status: Home / Self Care | Payer: BC Managed Care – PPO | Source: Ambulatory Visit | Attending: Internal Medicine | Admitting: Internal Medicine

## 2017-04-23 DIAGNOSIS — M898X1 Other specified disorders of bone, shoulder: Secondary | ICD-10-CM

## 2017-04-23 DIAGNOSIS — R059 Cough, unspecified: Secondary | ICD-10-CM

## 2017-04-23 DIAGNOSIS — Z720 Tobacco use: Secondary | ICD-10-CM

## 2017-04-23 DIAGNOSIS — R05 Cough: Secondary | ICD-10-CM

## 2017-06-10 ENCOUNTER — Other Ambulatory Visit: Payer: Self-pay | Admitting: Internal Medicine

## 2017-06-10 DIAGNOSIS — Z1231 Encounter for screening mammogram for malignant neoplasm of breast: Secondary | ICD-10-CM

## 2017-07-23 ENCOUNTER — Ambulatory Visit
Admission: RE | Admit: 2017-07-23 | Discharge: 2017-07-23 | Disposition: A | Discharge status: Home / Self Care | Payer: BC Managed Care – PPO | Source: Ambulatory Visit | Attending: Internal Medicine | Admitting: Internal Medicine

## 2017-07-23 DIAGNOSIS — Z1231 Encounter for screening mammogram for malignant neoplasm of breast: Secondary | ICD-10-CM

## 2018-06-13 ENCOUNTER — Other Ambulatory Visit: Payer: Self-pay | Admitting: Internal Medicine

## 2018-06-13 DIAGNOSIS — Z1231 Encounter for screening mammogram for malignant neoplasm of breast: Secondary | ICD-10-CM

## 2018-07-25 ENCOUNTER — Ambulatory Visit
Admission: RE | Admit: 2018-07-25 | Discharge: 2018-07-25 | Disposition: A | Discharge status: Home / Self Care | Payer: BC Managed Care – PPO | Source: Ambulatory Visit | Attending: Internal Medicine | Admitting: Internal Medicine

## 2018-07-25 DIAGNOSIS — Z1231 Encounter for screening mammogram for malignant neoplasm of breast: Secondary | ICD-10-CM

## 2018-08-09 LAB — LIPID PANEL
Cholesterol: 149 (ref 0–200)
HDL: 59 (ref 35–70)
LDL Cholesterol: 70
LDl/HDL Ratio: 1.2
Triglycerides: 98 (ref 40–160)

## 2018-08-09 LAB — TSH: TSH: 0.98 (ref 0.41–5.90)

## 2018-08-09 LAB — VITAMIN D 25 HYDROXY (VIT D DEFICIENCY, FRACTURES): Vit D, 25-Hydroxy: 70.4

## 2018-08-09 LAB — HEMOGLOBIN A1C: Hgb A1c MFr Bld: 5.9 (ref 4.0–6.0)

## 2018-09-28 ENCOUNTER — Encounter: Payer: Self-pay | Admitting: Internal Medicine

## 2018-09-28 ENCOUNTER — Ambulatory Visit: Payer: BC Managed Care – PPO | Admitting: Internal Medicine

## 2018-09-28 ENCOUNTER — Other Ambulatory Visit: Payer: Self-pay

## 2018-09-28 VITALS — BP 114/72 | HR 75 | Temp 97.7°F | Ht 66.0 in | Wt 192.4 lb

## 2018-09-28 DIAGNOSIS — J3089 Other allergic rhinitis: Secondary | ICD-10-CM | POA: Diagnosis not present

## 2018-09-28 DIAGNOSIS — E785 Hyperlipidemia, unspecified: Secondary | ICD-10-CM | POA: Insufficient documentation

## 2018-09-28 DIAGNOSIS — I10 Essential (primary) hypertension: Secondary | ICD-10-CM

## 2018-09-28 DIAGNOSIS — E1165 Type 2 diabetes mellitus with hyperglycemia: Secondary | ICD-10-CM | POA: Insufficient documentation

## 2018-09-28 DIAGNOSIS — IMO0002 Reserved for concepts with insufficient information to code with codable children: Secondary | ICD-10-CM | POA: Insufficient documentation

## 2018-09-28 MED ORDER — SEMAGLUTIDE(0.25 OR 0.5MG/DOS) 2 MG/1.5ML ~~LOC~~ SOPN: 0.5000 mg | PEN_INJECTOR | SUBCUTANEOUS | 2 refills | Status: DC

## 2018-09-28 NOTE — Patient Instructions (Signed)

## 2018-09-28 NOTE — Progress Notes (Signed)
  Subjective:     Patient ID: [name] , female    DOB: 1961-03-19 , 57 y.o.   MRN: [id number]   SHE IS HERE TODAY FOR FURTHER EVALUATION OF NASAL CONGESTION. SHE ALSO C/O SCRATCHY THROAT. HER SX STARTED ABOUT A WEEK AGO. SHE DENIES FEVER/CHILLS. SHE HAS TRIED OTC CLARITIN W/O RELIEF OF HER SX. DENIES ILL CONTACTS.     Past Medical History:  Diagnosis Date  . Diabetes mellitus   . Hyperlipemia   . Hypertension   . Hypoxemia   . Obesity   . Vitamin D deficiency       Current Outpatient Medications:  .  amLODipine (NORVASC) 5 MG tablet, Take 5 mg by mouth daily., Disp: , Rfl:  .  aspirin EC 81 MG tablet, Take 81 mg by mouth daily., Disp: , Rfl:  .  atorvastatin (LIPITOR) 20 MG tablet, Take 20 mg by mouth daily., Disp: , Rfl:  .  Cholecalciferol (VITAMIN D3) 5000 units CAPS, Take by mouth., Disp: , Rfl:  .  losartan (COZAAR) 100 MG tablet, Take 100 mg by mouth daily., Disp: , Rfl:  .  Semaglutide,0.25 or 0.5MG /DOS, (OZEMPIC, 0.25 OR 0.5 MG/DOSE,) 2 MG/1.5ML SOPN, Inject 0.5 mg into the skin once a week., Disp: 1 pen, Rfl: 2   Review of Systems  Constitutional: Negative.   HENT: Positive for congestion and postnasal drip.   Eyes: Negative.   Respiratory: Negative.   Cardiovascular: Negative.   Gastrointestinal: Negative.   Genitourinary: Negative.   Psychiatric/Behavioral: Negative.      Today's Vitals   09/28/18 1447  BP: 114/72  Pulse: 75  Temp: 97.7 F (36.5 C)  TempSrc: Oral  SpO2: 98%  Weight: 192 lb 6.4 oz (87.3 kg)  Height: 5\' 6"  (1.676 m)   Body mass index is 31.05 kg/m.   Objective:  Physical Exam  Constitutional: She is oriented to person, place, and time. She appears well-developed and well-nourished.  HENT:  Head: Normocephalic and atraumatic.  Right Ear: External ear normal.  Left Ear: External ear normal.  Nose: Nose normal.  Mouth/Throat: Oropharyngeal exudate (PHARYNX ERYTHEMA) present.  Eyes: Pupils are equal, round, and reactive to  light. Conjunctivae and EOM are normal.  Cardiovascular: Normal rate and regular rhythm.  Pulmonary/Chest: Effort normal and breath sounds normal.  Neurological: She is alert and oriented to person, place, and time.  Nursing note and vitals reviewed.       Assessment And Plan:     1. Seasonal allergic rhinitis due to other allergic trigger SHE WAS GIVEN SAMPLES OF NOREL AD ONE TAB TWICE DAILY X 3 DAYS. SHE IS ENCOURAGED TO DAILY ZYRTEC DAILY ONCE SHE HAS COMPLETED THE NOREL AD. SHE IS ENCOURAGED TO CALL OFFICE SHOULD HER SX PERSIST.   2. Essential hypertension, benign  WELL CONTROLLED. PT ADVISED THAT NOREL AD SHOULD NOT RAISE HER BP. SHE IS ENCOURAGED TO MONITOR BP IF SHE STARTS TO FEEL BAD.       [name], MD

## 2018-11-09 ENCOUNTER — Ambulatory Visit: Payer: BC Managed Care – PPO | Admitting: Internal Medicine

## 2018-11-28 ENCOUNTER — Encounter: Payer: Self-pay | Admitting: Internal Medicine

## 2018-11-28 ENCOUNTER — Ambulatory Visit: Payer: BC Managed Care – PPO | Admitting: Internal Medicine

## 2018-11-28 VITALS — BP 120/78 | HR 60 | Temp 98.0°F | Ht 66.5 in | Wt 194.4 lb

## 2018-11-28 DIAGNOSIS — Z6830 Body mass index (BMI) 30.0-30.9, adult: Secondary | ICD-10-CM

## 2018-11-28 DIAGNOSIS — E6609 Other obesity due to excess calories: Secondary | ICD-10-CM | POA: Diagnosis not present

## 2018-11-28 DIAGNOSIS — Z7982 Long term (current) use of aspirin: Secondary | ICD-10-CM

## 2018-11-28 DIAGNOSIS — E1165 Type 2 diabetes mellitus with hyperglycemia: Secondary | ICD-10-CM | POA: Diagnosis not present

## 2018-11-28 DIAGNOSIS — I10 Essential (primary) hypertension: Secondary | ICD-10-CM | POA: Diagnosis not present

## 2018-11-28 DIAGNOSIS — R200 Anesthesia of skin: Secondary | ICD-10-CM

## 2018-11-28 DIAGNOSIS — R202 Paresthesia of skin: Secondary | ICD-10-CM

## 2018-11-28 NOTE — Progress Notes (Signed)
Subjective:     Patient ID: [name] , female    DOB: 1961/05/03 , 57 y.o.   MRN: [id number]   Chief Complaint  Patient presents with  . Diabetes  . Hypertension    HPI  She feels great on Ozempic. She has not had any issues with the medication.   Diabetes  She presents for her follow-up diabetic visit. She has type 2 diabetes mellitus. Her disease course has been stable. There are no hypoglycemic associated symptoms. Pertinent negatives for hypoglycemia include no headaches. There are no diabetic associated symptoms. Pertinent negatives for diabetes include no blurred vision and no chest pain. There are no hypoglycemic complications. There are no diabetic complications. Risk factors for coronary artery disease include diabetes mellitus, dyslipidemia, hypertension, post-menopausal and sedentary lifestyle. She is compliant with treatment most of the time. She is following a diabetic diet.  Hypertension  This is a chronic problem. The current episode started more than 1 year ago. The problem has been gradually improving since onset. The problem is controlled. Pertinent negatives include no blurred vision, chest pain, headaches, palpitations or shortness of breath.   She reports compliance with meds.   Past Medical History:  Diagnosis Date  . Diabetes mellitus   . Hyperlipemia   . Hypertension   . Hypoxemia   . Obesity   . Vitamin D deficiency      Family History  Problem Relation Age of Onset  . Cancer Father   . Breast cancer Maternal Aunt   . Breast cancer Cousin      Current Outpatient Medications:  .  amLODipine (NORVASC) 5 MG tablet, Take 5 mg by mouth daily., Disp: , Rfl:  .  aspirin EC 81 MG tablet, Take 81 mg by mouth daily., Disp: , Rfl:  .  atorvastatin (LIPITOR) 20 MG tablet, Take 20 mg by mouth daily., Disp: , Rfl:  .  Cholecalciferol (VITAMIN D3) 5000 units CAPS, Take by mouth., Disp: , Rfl:  .  losartan (COZAAR) 100 MG tablet, Take 100 mg by mouth  daily., Disp: , Rfl:  .  Semaglutide,0.25 or 0.5MG/DOS, (OZEMPIC, 0.25 OR 0.5 MG/DOSE,) 2 MG/1.5ML SOPN, Inject 0.5 mg into the skin once a week., Disp: 1 pen, Rfl: 2   No Known Allergies   Review of Systems  Constitutional: Negative.   Eyes: Negative for blurred vision.  Respiratory: Negative.  Negative for shortness of breath.   Cardiovascular: Negative.  Negative for chest pain and palpitations.  Gastrointestinal: Negative.   Neurological: Negative.  Negative for headaches.  Psychiatric/Behavioral: Negative.      Today's Vitals   11/28/18 1533  BP: 120/78  Pulse: 60  Temp: 98 F (36.7 C)  TempSrc: Oral  Weight: 194 lb 6.4 oz (88.2 kg)  Height: 5' 6.5" (1.689 m)   Body mass index is 30.91 kg/m.   Objective:  Physical Exam  Constitutional: She is oriented to person, place, and time. She appears well-developed and well-nourished.  HENT:  Head: Normocephalic and atraumatic.  Eyes: EOM are normal.  Cardiovascular: Normal rate, regular rhythm and normal heart sounds.  Pulmonary/Chest: Effort normal and breath sounds normal.  Neurological: She is alert and oriented to person, place, and time.  Psychiatric: She has a normal mood and affect.  Nursing note and vitals reviewed.       Assessment And Plan:     1. Uncontrolled type 2 diabetes mellitus with hyperglycemia ([place])  I will check labs as listed below. She is encouraged to incorporate more  exercise into her daily routine.   - BMP8+EGFR - Hemoglobin A1c  2. Essential hypertension, benign  Well controlled. She will continue with current meds. She is encouraged to avoid adding salt to her foods.   3. Numbness and tingling in both hands  I think it is likely that her sx are due to diabetic neuropathy. However, due to past h/o metformin use, I will check vit b12 level today. I will make further recommendations once her labs are available for review.   - Vitamin B12  4. Class 1 obesity due to excess calories with  serious comorbidity and body mass index (BMI) of 30.0 to 30.9 in adult  She is encouraged to strive for BMI less than 27 to decrease cardiac risk. She is encouraged to exercise 30 minutes for at least four to five days weekly.   [name], MD

## 2018-11-28 NOTE — Patient Instructions (Signed)

## 2018-11-29 LAB — BMP8+EGFR
BUN/Creatinine Ratio: 15 (ref 9–23)
BUN: 14 mg/dL (ref 6–24)
CO2: 23 mmol/L (ref 20–29)
Calcium: 9.6 mg/dL (ref 8.7–10.2)
Chloride: 103 mmol/L (ref 96–106)
Creatinine, Ser: 0.96 mg/dL (ref 0.57–1.00)
GFR calc Af Amer: 76 mL/min/{1.73_m2} (ref >59)
GFR calc non Af Amer: 66 mL/min/{1.73_m2} (ref >59)
Glucose: 96 mg/dL (ref 65–99)
Potassium: 4.1 mmol/L (ref 3.5–5.2)
Sodium: 143 mmol/L (ref 134–144)

## 2018-11-29 LAB — HEMOGLOBIN A1C
Est. average glucose Bld gHb Est-mCnc: 120 mg/dL
Hgb A1c MFr Bld: 5.8 % — ABNORMAL HIGH (ref 4.8–5.6)

## 2018-11-29 LAB — VITAMIN B12: Vitamin B-12: 356 pg/mL (ref 232–1245)

## 2018-11-29 NOTE — Progress Notes (Signed)
Your kidney fxn is stable. Be sure to stay well hydrated.  Your a1c is great at 5.8. Kw--pls abstract last lipid and a1c

## 2018-11-30 ENCOUNTER — Encounter: Payer: Self-pay | Admitting: Internal Medicine

## 2018-12-16 ENCOUNTER — Other Ambulatory Visit: Payer: Self-pay | Admitting: Internal Medicine

## 2019-01-09 ENCOUNTER — Other Ambulatory Visit: Payer: Self-pay | Admitting: Internal Medicine

## 2019-01-18 ENCOUNTER — Other Ambulatory Visit: Payer: Self-pay

## 2019-01-18 MED ORDER — SAXAGLIPTIN HCL 5 MG PO TABS: 5.0000 mg | ORAL_TABLET | Freq: Every day | ORAL | 1 refills | Status: DC

## 2019-02-12 ENCOUNTER — Other Ambulatory Visit: Payer: Self-pay | Admitting: Internal Medicine

## 2019-03-21 ENCOUNTER — Other Ambulatory Visit: Payer: Self-pay | Admitting: Internal Medicine

## 2019-03-30 ENCOUNTER — Other Ambulatory Visit: Payer: Self-pay

## 2019-03-30 ENCOUNTER — Encounter: Payer: Self-pay | Admitting: Internal Medicine

## 2019-03-30 ENCOUNTER — Ambulatory Visit: Payer: BC Managed Care – PPO | Admitting: Internal Medicine

## 2019-03-30 VITALS — BP 112/64 | HR 76 | Temp 98.9°F | Ht 66.5 in | Wt 179.4 lb

## 2019-03-30 DIAGNOSIS — E1165 Type 2 diabetes mellitus with hyperglycemia: Secondary | ICD-10-CM

## 2019-03-30 DIAGNOSIS — I10 Essential (primary) hypertension: Secondary | ICD-10-CM

## 2019-03-30 DIAGNOSIS — R202 Paresthesia of skin: Secondary | ICD-10-CM

## 2019-03-30 DIAGNOSIS — R634 Abnormal weight loss: Secondary | ICD-10-CM

## 2019-03-30 DIAGNOSIS — E7800 Pure hypercholesterolemia, unspecified: Secondary | ICD-10-CM | POA: Diagnosis not present

## 2019-03-30 DIAGNOSIS — J029 Acute pharyngitis, unspecified: Secondary | ICD-10-CM

## 2019-03-30 MED ORDER — FLUTICASONE PROPIONATE 50 MCG/ACT NA SUSP: 2.0000 | Freq: Every day | NASAL | 2 refills | Status: DC

## 2019-03-30 NOTE — Progress Notes (Signed)
Subjective:     Patient ID: [name] , female    DOB: Apr 13, 1961 , 58 y.o.   MRN: [id number]   Chief Complaint  Patient presents with  . Diabetes  . Hypertension    HPI  Diabetes  She presents for her follow-up diabetic visit. She has type 2 diabetes mellitus. There are no hypoglycemic associated symptoms. Pertinent negatives for diabetes include no blurred vision and no chest pain. There are no hypoglycemic complications. Risk factors for coronary artery disease include dyslipidemia and post-menopausal. Her weight is decreasing steadily. Her breakfast blood glucose is taken between 8-9 am. Her breakfast blood glucose range is generally 110-130 mg/dl. An ACE inhibitor/angiotensin II receptor blocker is being taken. Eye exam is not current.  Hypertension  This is a chronic problem. The current episode started more than 1 year ago. The problem has been gradually improving since onset. The problem is controlled. Pertinent negatives include no blurred vision, chest pain, palpitations or shortness of breath.   She reports compliance with meds.   Past Medical History:  Diagnosis Date  . Diabetes mellitus   . Hyperlipemia   . Hypertension   . Hypoxemia   . Obesity   . Vitamin D deficiency      Family History  Problem Relation Age of Onset  . Heart disease Mother   . Uterine cancer Mother   . Cancer Father   . Breast cancer Maternal Aunt   . Breast cancer Cousin   . Cancer Maternal Grandmother      Current Outpatient Medications:  .  amLODipine (NORVASC) 5 MG tablet, TAKE 1 TABLET BY MOUTH EVERY DAY, Disp: 90 tablet, Rfl: 0 .  aspirin EC 81 MG tablet, Take 81 mg by mouth daily., Disp: , Rfl:  .  atorvastatin (LIPITOR) 20 MG tablet, TAKE 1 TABLET BY MOUTH EVERY DAY, Disp: 90 tablet, Rfl: 1 .  Cholecalciferol (VITAMIN D3) 5000 units CAPS, Take by mouth., Disp: , Rfl:  .  losartan (COZAAR) 100 MG tablet, TAKE 1 TABLET BY MOUTH EVERY DAY, Disp: 90 tablet, Rfl: 1 .  OZEMPIC,  0.25 OR 0.5 MG/DOSE, 2 MG/1.5ML SOPN, INJECT 0.5 MG UNDER THE SKIN ONCE A WEEK, Disp: , Rfl:    No Known Allergies   Review of Systems  Constitutional: Negative.   HENT: Positive for postnasal drip and sore throat.   Eyes: Negative for blurred vision.  Respiratory: Negative.  Negative for shortness of breath.   Cardiovascular: Negative.  Negative for chest pain and palpitations.  Gastrointestinal: Negative.   Neurological: Negative.   Psychiatric/Behavioral: Negative.      Today's Vitals   03/30/19 0901  BP: 112/64  Pulse: 76  Temp: 98.9 F (37.2 C)  TempSrc: Oral  Weight: 179 lb 6.4 oz (81.4 kg)  Height: 5' 6.5" (1.689 m)  PainSc: 5   PainLoc: Throat   Body mass index is 28.52 kg/m.   Objective:  Physical Exam Vitals signs and nursing note reviewed.  Constitutional:      Appearance: Normal appearance.  HENT:     Head: Normocephalic and atraumatic.  Cardiovascular:     Rate and Rhythm: Normal rate and regular rhythm.     Heart sounds: Normal heart sounds.  Pulmonary:     Effort: Pulmonary effort is normal.     Breath sounds: Normal breath sounds.  Skin:    General: Skin is warm.  Neurological:     General: No focal deficit present.     Mental Status: She is alert.  Psychiatric:        Mood and Affect: Mood normal.        Behavior: Behavior normal.         Assessment And Plan:     1. Uncontrolled type 2 diabetes mellitus with hyperglycemia ([place])  I will check labs as listed below.  She feels she has lost enough weight. We have agreed to decrease her ozempic dose to 0.37m once weekly. I will make further recommendations once her labs are available for review.   - Lipid panel - CMP14+EGFR - Hemoglobin A1c  2. Essential hypertension, benign  Well controlled. She will continue with current meds. She is encouraged to avoid adding salt to her foods.   3. Pure hypercholesterolemia  I will check a fasting lipid panel and LFTs. She is encouraged to avoid  fried foods and to exercise 30 minutes five days weekly.   4. Pharyngitis, unspecified etiology  Her strep test is negative. I think her symptoms are due to postnasal drip.   5. Weight loss  I think this is due to Ozempic. I will decrease her dose to see if this stops her weight loss. However, the patient is concerned there is another cause. I will check her thyroid function today.   6. Paresthesia  I will check a vitamin B12 level today. Pt advised her symptoms are likely due to diabetic neuropathy. I will make further recommendations once her labs are available    RMaximino Greenland MD    THE PATIENT IS ENCOURAGED TO PRACTICE SOCIAL DISTANCING DUE TO THE COVID-19 PANDEMIC.

## 2019-03-30 NOTE — Patient Instructions (Signed)
Diabetic Neuropathy  Diabetic neuropathy refers to nerve damage that is caused by diabetes (diabetes mellitus). Over time, people with diabetes can develop nerve damage throughout the body. There are several types of diabetic neuropathy:  · Peripheral neuropathy. This is the most common type of diabetic neuropathy. It causes damage to nerves that carry signals between the spinal cord and other parts of the body (peripheral nerves). This usually affects nerves in the feet and legs first, and may eventually affect the hands and arms. The damage affects the ability to sense touch or temperature.  · Autonomic neuropathy. This type causes damage to nerves that control involuntary functions (autonomic nerves). These nerves carry signals that control:  ? Heartbeat.  ? Body temperature.  ? Blood pressure.  ? Urination.  ? Digestion.  ? Sweating.  ? Sexual function.  ? Response to changing blood sugar (glucose) levels.  · Focal neuropathy. This type of nerve damage affects one area of the body, such as an arm, a leg, or the face. The injury may involve one nerve or a small group of nerves. Focal neuropathy can be painful and unpredictable, and occurs most often in older adults with diabetes. This often develops suddenly, but usually improves over time and does not cause long-term problems.  · Proximal neuropathy. This type of nerve damage affects the nerves of the thighs, hips, buttocks, or legs. It causes severe pain, weakness, and muscle death (atrophy), usually in the thigh muscles. It is more common among older men and people who have type 2 diabetes. The length of recovery time may vary.  What are the causes?  Peripheral, autonomic, and focal neuropathies are caused by diabetes that is not well controlled with treatment. The cause of proximal neuropathy is not known, but it may be caused by inflammation related to uncontrolled blood glucose levels.  What are the signs or symptoms?  Peripheral neuropathy  Peripheral  neuropathy develops slowly over time. When the nerves of the feet and legs no longer work, you may experience:  · Burning, stabbing, or aching pain in the legs or feet.  · Pain or cramping in the legs or feet.  · Loss of feeling (numbness) and inability to feel pressure or pain in the feet. This can lead to:  ? Thick calluses or sores on areas of constant pressure.  ? Ulcers.  ? Reduced ability to feel temperature changes.  · Foot deformities.  · Muscle weakness.  · Loss of balance or coordination.  Autonomic neuropathy  The symptoms of autonomic neuropathy vary depending on which nerves are affected. Symptoms may include:  · Problems with digestion, such as:  ? Nausea or vomiting.  ? Poor appetite.  ? Bloating.  ? Diarrhea or constipation.  ? Trouble swallowing.  ? Losing weight without trying to.  · Problems with the heart, blood and lungs, such as:  ? Dizziness, especially when standing up.  ? Fainting.  ? Shortness of breath.  ? Irregular heartbeat.  · Bladder problems, such as:  ? Trouble starting or stopping urination.  ? Leaking urine.  ? Trouble emptying the bladder.  ? Urinary tract infections (UTIs).  · Problems with other body functions, such as:  ? Sweat. You may sweat too much or too little.  ? Temperature. You might get hot easily. Or, you might feel cold more than usual.  ? Sexual function. Men may not be able to get or maintain an erection. Women may have vaginal dryness and difficulty with arousal.    Focal neuropathy  Symptoms affect only one area of the body. Common symptoms include:  · Numbness.  · Tingling.  · Burning pain.  · Prickling feeling.  · Very sensitive skin.  · Weakness.  · Inability to move (paralysis).  · Muscle twitching.  · Muscles getting smaller (wasting).  · Poor coordination.  · Double or blurred vision.  Proximal neuropathy  · Sudden, severe pain in the hip, thigh, or buttocks. Pain may spread from the back into the legs (sciatica).  · Pain and numbness in the arms and  legs.  · Tingling.  · Loss of bladder control or bowel control.  · Weakness and wasting of thigh muscles.  · Difficulty getting up from a seated position.  · Abdominal swelling.  · Unexplained weight loss.  How is this diagnosed?  Diagnosis usually involves reviewing your medical history and any symptoms you have. Diagnosis varies depending on the type of neuropathy your health care provider suspects.  Peripheral neuropathy  Your health care provider will check areas that are affected by your nervous system (neurologic exam), such as your reflexes, how you move, and what you can feel. You may have other tests, such as:  · Blood tests.  · Removal and examination of fluid that surrounds the spinal cord (lumbar puncture).  · CT scan.  · MRI.  · A test to check the nerves that control muscles (electromyogram, EMG).  · Tests of how quickly messages pass through your nerves (nerve conduction velocity tests).  · Removal of a small piece of nerve to be examined under a microscope (biopsy).  Autonomic neuropathy  You may have tests, such as:  · Tests to measure your blood pressure and heart rate. This may include monitoring you while you are safely secured to an exam table that moves you from a lying position to an upright position (table tilt test).  · Breathing tests to check your lungs.  · Tests to check how food moves through the digestive system (gastric emptying tests).  · Blood, sweat, or urine tests.  · Ultrasound of your bladder.  · Spinal fluid tests.  Focal neuropathy  This condition may be diagnosed with:  · A neurologic exam.  · CT scan.  · MRI.  · EMG.  · Nerve conduction velocity tests.  Proximal neuropathy  There is no test to diagnose this type of neuropathy. You may have tests to rule out other possible causes of this type of neuropathy. Tests may include:  · X-rays of your spine and lumbar region.  · Lumbar puncture.  · MRI.  How is this treated?  The goal of treatment is to keep nerve damage from getting  worse. The most important part of treatment is keeping your blood glucose level and your A1C level within your target range by following your diabetes management plan. Over time, maintaining lower blood glucose levels helps lessen symptoms. In some cases, you may need prescription pain medicine.  Follow these instructions at home:    Lifestyle    · Do not use any products that contain nicotine or tobacco, such as cigarettes and e-cigarettes. If you need help quitting, ask your health care provider.  · Be physically active every day. Include strength training and balance exercises.  · Follow a healthy meal plan.  · Work with your health care provider to manage your blood pressure.  General instructions  · Follow your diabetes management plan as directed.  ? Check your blood glucose levels as directed   by your health care provider.  ? Keep your blood glucose in your target range as directed by your health care provider.  ? Have your A1C level checked at least two times a year, or as often as told by your health care provider.  · Take over the counter and prescription medicines only as told by your health care provider. This includes insulin and diabetes medicine.  · Do not drive or use heavy machinery while taking prescription pain medicines.  · Check your skin and feet every day for cuts, bruises, redness, blisters, or sores.  · Keep all follow up visits as told by your health care provider. This is important.  Contact a health care provider if:  · You have burning, stabbing, or aching pain in your legs or feet.  · You are unable to feel pressure or pain in your feet.  · You develop problems with digestion, such as:  ? Nausea.  ? Vomiting.  ? Bloating.  ? Constipation.  ? Diarrhea.  ? Abdominal pain.  · You have difficulty with urination, such as inability:  ? To control when you urinate (incontinence).  ? To completely empty the bladder (retention).  · You have palpitations.  · You feel dizzy, weak, or faint when you  stand up.  Get help right away if:  · You cannot urinate.  · You have sudden weakness or loss of coordination.  · You have trouble speaking.  · You have pain or pressure in your chest.  · You have an irregular heart beat.  · You have sudden inability to move a part of your body.  Summary  · Diabetic neuropathy refers to nerve damage that is caused by diabetes. It can affect nerves throughout the entire body, causing numbness and pain in the arms, legs, digestive tract, heart, and other body systems.  · Keep your blood glucose level and your blood pressure in your target range, as directed by your health care provider. This can help prevent neuropathy from getting worse.  · Check your skin and feet every day for cuts, bruises, redness, blisters, or sores.  · Do not use any products that contain nicotine or tobacco, such as cigarettes and e-cigarettes. If you need help quitting, ask your health care provider.  This information is not intended to replace advice given to you by your health care provider. Make sure you discuss any questions you have with your health care provider.  Document Released: 02/15/2002 Document Revised: 01/19/2018 Document Reviewed: 01/11/2017  Elsevier Interactive Patient Education © 2019 [employer]

## 2019-03-31 ENCOUNTER — Encounter: Payer: Self-pay | Admitting: Internal Medicine

## 2019-03-31 LAB — CMP14+EGFR
ALT: 22 IU/L (ref 0–32)
AST: 20 IU/L (ref 0–40)
Albumin/Globulin Ratio: 2 (ref 1.2–2.2)
Albumin: 4.7 g/dL (ref 3.8–4.9)
Alkaline Phosphatase: 116 IU/L (ref 39–117)
BUN/Creatinine Ratio: 13 (ref 9–23)
BUN: 15 mg/dL (ref 6–24)
Bilirubin Total: 0.4 mg/dL (ref 0.0–1.2)
CO2: 25 mmol/L (ref 20–29)
Calcium: 9.7 mg/dL (ref 8.7–10.2)
Chloride: 102 mmol/L (ref 96–106)
Creatinine, Ser: 1.13 mg/dL — ABNORMAL HIGH (ref 0.57–1.00)
GFR calc Af Amer: 62 mL/min/{1.73_m2} (ref >59)
GFR calc non Af Amer: 54 mL/min/{1.73_m2} — ABNORMAL LOW (ref >59)
Globulin, Total: 2.3 g/dL (ref 1.5–4.5)
Glucose: 83 mg/dL (ref 65–99)
Potassium: 4.4 mmol/L (ref 3.5–5.2)
Sodium: 143 mmol/L (ref 134–144)
Total Protein: 7 g/dL (ref 6.0–8.5)

## 2019-03-31 LAB — HEMOGLOBIN A1C
Est. average glucose Bld gHb Est-mCnc: 117 mg/dL
Hgb A1c MFr Bld: 5.7 % — ABNORMAL HIGH (ref 4.8–5.6)

## 2019-03-31 LAB — LIPID PANEL
Chol/HDL Ratio: 2.7 ratio (ref 0.0–4.4)
Cholesterol, Total: 146 mg/dL (ref 100–199)
HDL: 54 mg/dL (ref >39)
LDL Calculated: 70 mg/dL (ref 0–99)
Triglycerides: 110 mg/dL (ref 0–149)
VLDL Cholesterol Cal: 22 mg/dL (ref 5–40)

## 2019-03-31 LAB — VITAMIN B12: Vitamin B-12: 419 pg/mL (ref 232–1245)

## 2019-03-31 LAB — TSH: TSH: 1.2 u[IU]/mL (ref 0.450–4.500)

## 2019-04-16 ENCOUNTER — Other Ambulatory Visit: Payer: Self-pay | Admitting: Internal Medicine

## 2019-04-16 ENCOUNTER — Encounter: Payer: Self-pay | Admitting: Internal Medicine

## 2019-04-24 ENCOUNTER — Encounter: Payer: Self-pay | Admitting: Internal Medicine

## 2019-05-24 ENCOUNTER — Other Ambulatory Visit: Payer: Self-pay

## 2019-05-24 ENCOUNTER — Encounter: Payer: Self-pay | Admitting: Internal Medicine

## 2019-05-24 ENCOUNTER — Ambulatory Visit: Payer: BC Managed Care – PPO | Admitting: Internal Medicine

## 2019-05-24 VITALS — BP 120/68 | HR 94 | Temp 98.5°F | Ht 66.4 in | Wt 173.8 lb

## 2019-05-24 DIAGNOSIS — F17210 Nicotine dependence, cigarettes, uncomplicated: Secondary | ICD-10-CM

## 2019-05-24 DIAGNOSIS — R1013 Epigastric pain: Secondary | ICD-10-CM | POA: Diagnosis not present

## 2019-05-24 DIAGNOSIS — E1165 Type 2 diabetes mellitus with hyperglycemia: Secondary | ICD-10-CM | POA: Diagnosis not present

## 2019-05-24 DIAGNOSIS — Z122 Encounter for screening for malignant neoplasm of respiratory organs: Secondary | ICD-10-CM

## 2019-05-24 DIAGNOSIS — I10 Essential (primary) hypertension: Secondary | ICD-10-CM

## 2019-05-24 NOTE — Progress Notes (Signed)
Subjective:     Patient ID: [name] , female    DOB: Apr 23, 1961 , 58 y.o.   MRN: [id number]   Chief Complaint  Patient presents with  . Diabetes  . Hypertension    HPI  She is here today for f/u diabetes. Her dose of Ozempic was decreased to 0.25mg  once weekly. She has lost another six pounds. She admits that she has picked up smoking. She has been stressed with upcoming move [place] in the midst of the COVID pandemic. She is moving to [place] this weekend. She hates that she did not have time to purge a lot of things. Her home sold in 3 days. She is excited, but also apprehensive about the move. She is happy to be moving closer to her sister.   Hypertension  This is a chronic problem. The current episode started more than 1 year ago. The problem has been gradually improving since onset. The problem is controlled. Pertinent negatives include no blurred vision, chest pain, palpitations or shortness of breath. Risk factors for coronary artery disease include diabetes mellitus, dyslipidemia, smoking/tobacco exposure and post-menopausal state.     Past Medical History:  Diagnosis Date  . Diabetes mellitus   . Hyperlipemia   . Hypertension   . Hypoxemia   . Obesity   . Vitamin D deficiency      Family History  Problem Relation Age of Onset  . Heart disease Mother   . Uterine cancer Mother   . Cancer Father   . Breast cancer Maternal Aunt   . Breast cancer Cousin   . Cancer Maternal Grandmother      Current Outpatient Medications:  .  amLODipine (NORVASC) 5 MG tablet, TAKE 1 TABLET BY MOUTH EVERY DAY, Disp: 90 tablet, Rfl: 0 .  aspirin EC 81 MG tablet, Take 81 mg by mouth daily., Disp: , Rfl:  .  atorvastatin (LIPITOR) 20 MG tablet, TAKE 1 TABLET BY MOUTH EVERY DAY, Disp: 90 tablet, Rfl: 1 .  Cholecalciferol (VITAMIN D3) 5000 units CAPS, Take by mouth., Disp: , Rfl:  .  fluticasone (FLONASE) 50 MCG/ACT nasal spray, Place 2 sprays into both nostrils daily., Disp:  16 g, Rfl: 2 .  losartan (COZAAR) 100 MG tablet, TAKE 1 TABLET BY MOUTH EVERY DAY, Disp: 90 tablet, Rfl: 1 .  OZEMPIC, 0.25 OR 0.5 MG/DOSE, 2 MG/1.5ML SOPN, INJECT 0.5 MG UNDER THE SKIN ONCE A WEEK (Patient taking differently: 0.25 Units. ), Disp: 3 pen, Rfl: 1   No Known Allergies   Review of Systems  Constitutional: Negative.   Eyes: Negative for blurred vision.  Respiratory: Negative.  Negative for shortness of breath.   Cardiovascular: Negative.  Negative for chest pain and palpitations.  Gastrointestinal: Positive for abdominal pain.       She c/o epigastric discomfort. Intermittent. Described as sharp, shooting pain. Not associated with food intake. Unable to determine triggers. NO n/v/d.   Neurological: Negative.   Psychiatric/Behavioral: Negative.      Today's Vitals   05/24/19 0939  BP: 120/68  Pulse: 94  Temp: 98.5 F (36.9 C)  TempSrc: Oral  SpO2: 98%  Weight: 173 lb 12.8 oz (78.8 kg)  Height: 5' 6.4" (1.687 m)   Body mass index is 27.72 kg/m.   Objective:  Physical Exam Vitals signs and nursing note reviewed.  Constitutional:      Appearance: Normal appearance.  HENT:     Head: Normocephalic and atraumatic.  Cardiovascular:     Rate and Rhythm:  Normal rate and regular rhythm.     Heart sounds: Normal heart sounds.  Pulmonary:     Effort: Pulmonary effort is normal.     Breath sounds: Normal breath sounds.  Abdominal:     General: Bowel sounds are normal. There is no distension.     Palpations: Abdomen is soft.     Tenderness: There is no abdominal tenderness. There is no guarding.  Skin:    General: Skin is warm.  Neurological:     General: No focal deficit present.     Mental Status: She is alert.  Psychiatric:        Mood and Affect: Mood normal.        Behavior: Behavior normal.        Assessment And Plan:     1. Uncontrolled type 2 diabetes mellitus with hyperglycemia ([facility])  We spent a lot of time discussing pros and cons of continuing  with current therapy. She would like to stop taking the Ozempic. Her most recent a1c is well controlled, less than 6.0. She will rto in 3 months for her next evaluation. She is in agreement with her current treatment plan. Greater than 50% of face to face time was spent in counseling and coordination of care. She is reminded that dietary and exercise compliance is important in maintaining optimal glycemic control.   2. Essential hypertension, benign  Chronic, well controlled. She will continue with current meds.   3. Epigastric pain  I will check labs as listed below. She is encouraged to pay attention to triggers. If persistent, she may benefit from short-term use of PPI therapy. If pancreatic enzymes are elevated, I will move forward with abdominal ultrasound.   - Amylase - Lipase  4. Cigarette nicotine dependence without complication  She has resumed smoking. She reports having 30+ packyear history of tobacco use. She started in 1980, smoked 1ppd for at least 25 years, stopped for a moment, and then resumed smoking. She had quit for about 3 years, and recently started smoking again. Given this, and her h/o weight loss, she is a candidate for low dose CT chest to screen for lung cancer. She is in agreement with her treatment plan.   5. Encounter for screening for malignant neoplasm of respiratory organs  - CT CHEST LUNG CA SCREEN LOW DOSE W/O CM; Future   [name], MD    THE PATIENT IS ENCOURAGED TO PRACTICE SOCIAL DISTANCING DUE TO THE COVID-19 PANDEMIC.

## 2019-05-25 LAB — AMYLASE: Amylase: 44 U/L (ref 31–110)

## 2019-05-25 LAB — LIPASE: Lipase: 37 U/L (ref 14–72)

## 2019-05-26 ENCOUNTER — Encounter: Payer: Self-pay | Admitting: Internal Medicine

## 2019-05-31 ENCOUNTER — Encounter: Payer: Self-pay | Admitting: Internal Medicine

## 2019-06-12 ENCOUNTER — Other Ambulatory Visit: Payer: Self-pay | Admitting: Internal Medicine

## 2019-06-12 DIAGNOSIS — Z1231 Encounter for screening mammogram for malignant neoplasm of breast: Secondary | ICD-10-CM

## 2019-06-18 ENCOUNTER — Other Ambulatory Visit: Payer: Self-pay | Admitting: Internal Medicine

## 2019-06-21 ENCOUNTER — Other Ambulatory Visit: Payer: Self-pay

## 2019-06-21 MED ORDER — ATORVASTATIN CALCIUM 20 MG PO TABS: 20.0000 mg | ORAL_TABLET | Freq: Every day | ORAL | 1 refills | Status: DC

## 2019-06-21 MED ORDER — AMLODIPINE BESYLATE 5 MG PO TABS: 5.0000 mg | ORAL_TABLET | Freq: Every day | ORAL | 1 refills | Status: DC

## 2019-06-27 ENCOUNTER — Encounter: Payer: Self-pay | Admitting: Internal Medicine

## 2019-07-03 ENCOUNTER — Encounter: Payer: Self-pay | Admitting: Internal Medicine

## 2019-07-05 ENCOUNTER — Other Ambulatory Visit: Payer: Self-pay

## 2019-07-05 ENCOUNTER — Other Ambulatory Visit: Payer: BC Managed Care – PPO

## 2019-07-05 DIAGNOSIS — Z23 Encounter for immunization: Secondary | ICD-10-CM

## 2019-07-06 ENCOUNTER — Other Ambulatory Visit: Payer: Self-pay

## 2019-07-06 ENCOUNTER — Other Ambulatory Visit: Payer: BC Managed Care – PPO

## 2019-07-06 DIAGNOSIS — Z23 Encounter for immunization: Secondary | ICD-10-CM

## 2019-07-09 LAB — QUANTIFERON-TB GOLD PLUS
QuantiFERON Mitogen Value: >10 IU/mL
QuantiFERON Nil Value: 0.03 IU/mL
QuantiFERON TB1 Ag Value: 0.03 IU/mL
QuantiFERON TB2 Ag Value: 0.03 IU/mL
QuantiFERON-TB Gold Plus: NEGATIVE

## 2019-07-10 NOTE — Progress Notes (Signed)
Your TB test is negative. 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 Do you have a number to fax this to?

## 2019-07-13 LAB — HM DIABETES EYE EXAM

## 2019-07-14 ENCOUNTER — Encounter: Payer: Self-pay | Admitting: Internal Medicine

## 2019-07-28 ENCOUNTER — Ambulatory Visit
Admission: RE | Admit: 2019-07-28 | Discharge: 2019-07-28 | Disposition: A | Discharge status: Home / Self Care | Payer: BC Managed Care – PPO | Source: Ambulatory Visit | Attending: Internal Medicine | Admitting: Internal Medicine

## 2019-07-28 ENCOUNTER — Other Ambulatory Visit: Payer: Self-pay

## 2019-07-28 DIAGNOSIS — Z1231 Encounter for screening mammogram for malignant neoplasm of breast: Secondary | ICD-10-CM

## 2019-08-13 ENCOUNTER — Other Ambulatory Visit: Payer: Self-pay | Admitting: Internal Medicine

## 2019-08-29 ENCOUNTER — Telehealth: Payer: Self-pay | Admitting: Internal Medicine

## 2019-08-29 NOTE — Telephone Encounter (Signed)
Called patient to move appointment from 9/14 to 9/15 at 3 pm. LMOVM to call the office back to confirm the time change or deny.

## 2019-09-04 ENCOUNTER — Encounter: Payer: BC Managed Care – PPO | Admitting: Internal Medicine

## 2019-09-18 ENCOUNTER — Other Ambulatory Visit: Payer: Self-pay

## 2019-09-18 MED ORDER — AMLODIPINE BESYLATE 5 MG PO TABS: 5.0000 mg | ORAL_TABLET | Freq: Every day | ORAL | 1 refills | Status: DC

## 2019-09-25 ENCOUNTER — Other Ambulatory Visit: Payer: Self-pay

## 2019-09-25 MED ORDER — FLUTICASONE PROPIONATE 50 MCG/ACT NA SUSP: NASAL | 2 refills | Status: DC

## 2019-10-02 ENCOUNTER — Encounter: Payer: Self-pay | Admitting: Internal Medicine

## 2019-10-02 ENCOUNTER — Ambulatory Visit: Payer: BC Managed Care – PPO | Admitting: Internal Medicine

## 2019-10-02 ENCOUNTER — Other Ambulatory Visit: Payer: Self-pay

## 2019-10-02 VITALS — BP 120/78 | HR 74 | Temp 98.4°F | Ht 66.4 in | Wt 186.6 lb

## 2019-10-02 DIAGNOSIS — E1165 Type 2 diabetes mellitus with hyperglycemia: Secondary | ICD-10-CM

## 2019-10-02 DIAGNOSIS — Z0000 Encounter for general adult medical examination without abnormal findings: Secondary | ICD-10-CM

## 2019-10-02 DIAGNOSIS — I10 Essential (primary) hypertension: Secondary | ICD-10-CM | POA: Diagnosis not present

## 2019-10-02 DIAGNOSIS — Z23 Encounter for immunization: Secondary | ICD-10-CM

## 2019-10-02 DIAGNOSIS — B360 Pityriasis versicolor: Secondary | ICD-10-CM | POA: Diagnosis not present

## 2019-10-02 LAB — POCT URINALYSIS DIPSTICK
Bilirubin, UA: NEGATIVE
Glucose, UA: NEGATIVE
Ketones, UA: NEGATIVE
Leukocytes, UA: NEGATIVE
Nitrite, UA: NEGATIVE
Protein, UA: NEGATIVE
Spec Grav, UA: 1.02 (ref 1.010–1.025)
Urobilinogen, UA: 0.2 E.U./dL
pH, UA: 5.5 (ref 5.0–8.0)

## 2019-10-02 LAB — POCT UA - MICROALBUMIN
Albumin/Creatinine Ratio, Urine, POC: <30
Creatinine, POC: 300 mg/dL
Microalbumin Ur, POC: 10 mg/L

## 2019-10-02 NOTE — Progress Notes (Signed)
Subjective:     Patient ID: [name] , female    DOB: June 02, 1961 , 58 y.o.   MRN: [id number]   Chief Complaint  Patient presents with  . Annual Exam    HPI  She is here today for a full physical examination. She is followed by Dr. Garwin Brothers for her GYN exams. Her last visit was December 6th, 2019.  Diabetes She presents for her follow-up diabetic visit. She has type 2 diabetes mellitus. There are no hypoglycemic associated symptoms. Pertinent negatives for diabetes include no blurred vision and no chest pain. There are no hypoglycemic complications. Risk factors for coronary artery disease include diabetes mellitus, dyslipidemia, hypertension, post-menopausal and sedentary lifestyle. She is following a diabetic diet. She participates in exercise intermittently. An ACE inhibitor/angiotensin II receptor blocker is being taken. She does not see a podiatrist.Eye exam is current.  Hypertension This is a chronic problem. The current episode started more than 1 year ago. The problem has been gradually improving since onset. The problem is controlled. Pertinent negatives include no blurred vision, chest pain, palpitations or shortness of breath. Risk factors for coronary artery disease include diabetes mellitus, dyslipidemia, smoking/tobacco exposure and post-menopausal state. Compliance problems include exercise.      Past Medical History:  Diagnosis Date  . Diabetes mellitus   . Hyperlipemia   . Hypertension   . Hypoxemia   . Obesity   . Vitamin D deficiency      Family History  Problem Relation Age of Onset  . Heart disease Mother   . Uterine cancer Mother   . Cancer Father   . Breast cancer Maternal Aunt   . Breast cancer Cousin   . Cancer Maternal Grandmother      Current Outpatient Medications:  .  amLODipine (NORVASC) 5 MG tablet, Take 1 tablet (5 mg total) by mouth daily., Disp: 90 tablet, Rfl: 1 .  aspirin EC 81 MG tablet, Take 81 mg by mouth daily., Disp: , Rfl:  .   atorvastatin (LIPITOR) 20 MG tablet, Take 1 tablet (20 mg total) by mouth daily., Disp: 90 tablet, Rfl: 1 .  Cholecalciferol (VITAMIN D3) 5000 units CAPS, Take by mouth., Disp: , Rfl:  .  fluticasone (FLONASE) 50 MCG/ACT nasal spray, SHAKE LIQUID AND USE 2 SPRAYS IN EACH NOSTRIL DAILY, Disp: 16 g, Rfl: 2 .  losartan (COZAAR) 100 MG tablet, TAKE 1 TABLET BY MOUTH EVERY DAY, Disp: 90 tablet, Rfl: 1 .  OZEMPIC, 0.25 OR 0.5 MG/DOSE, 2 MG/1.5ML SOPN, INJECT 0.5 MG UNDER THE SKIN ONCE A WEEK (Patient not taking: No sig reported), Disp: 3 pen, Rfl: 1   No Known Allergies   Review of Systems  Constitutional: Negative.   HENT: Negative.   Eyes: Negative.  Negative for blurred vision.  Respiratory: Negative.  Negative for shortness of breath.   Cardiovascular: Negative.  Negative for chest pain and palpitations.  Endocrine: Negative.   Genitourinary: Negative.   Musculoskeletal: Negative.   Skin: Negative.   Allergic/Immunologic: Negative.   Neurological: Negative.   Hematological: Negative.   Psychiatric/Behavioral: Negative.      Today's Vitals   10/02/19 1112  BP: 120/78  Pulse: 74  Temp: 98.4 F (36.9 C)  TempSrc: Oral  Weight: 186 lb 9.6 oz (84.6 kg)  Height: 5' 6.4" (1.687 m)   Body mass index is 29.76 kg/m.   Objective:  Physical Exam Vitals signs and nursing note reviewed.  Constitutional:      Appearance: Normal appearance.  HENT:  Head: Normocephalic and atraumatic.     Right Ear: Tympanic membrane, ear canal and external ear normal.     Left Ear: Tympanic membrane, ear canal and external ear normal.     Nose: Nose normal.     Mouth/Throat:     Mouth: Mucous membranes are moist.     Pharynx: Oropharynx is clear.  Eyes:     Extraocular Movements: Extraocular movements intact.     Conjunctiva/sclera: Conjunctivae normal.     Pupils: Pupils are equal, round, and reactive to light.  Neck:     Musculoskeletal: Normal range of motion and neck supple.   Cardiovascular:     Rate and Rhythm: Normal rate and regular rhythm.     Pulses: Normal pulses.          Dorsalis pedis pulses are 2+ on the right side and 2+ on the left side.     Heart sounds: Normal heart sounds.  Pulmonary:     Effort: Pulmonary effort is normal.     Breath sounds: Normal breath sounds.  Chest:     Breasts: Tanner Score is 5.        Right: Normal.        Left: Normal.  Abdominal:     General: Abdomen is flat. Bowel sounds are normal.     Palpations: Abdomen is soft.  Genitourinary:    Comments: deferred Musculoskeletal: Normal range of motion.  Feet:     Right foot:     Protective Sensation: 5 sites tested. 5 sites sensed.     Skin integrity: Skin integrity normal.     Left foot:     Protective Sensation: 5 sites tested. 5 sites sensed.     Skin integrity: Skin integrity normal.     Comments: Some toenails are darkened.  Skin:    General: Skin is warm and dry.     Comments: Areas of hypopigmentation, more pronounced on left anterior chest  Neurological:     General: No focal deficit present.     Mental Status: She is alert and oriented to person, place, and time.  Psychiatric:        Mood and Affect: Mood normal.        Behavior: Behavior normal.         Assessment And Plan:     1. Encounter for annual physical exam  A full exam was performed.  Importance of monthly self breast exams was discussed with the patient. PATIENT HAS BEEN ADVISED TO GET 30-45 MINUTES REGULAR EXERCISE NO LESS THAN FOUR TO FIVE DAYS PER WEEK - BOTH WEIGHTBEARING EXERCISES AND AEROBIC ARE RECOMMENDED.  SHE WAS ADVISED TO FOLLOW A HEALTHY DIET WITH AT LEAST SIX FRUITS/VEGGIES PER DAY, DECREASE INTAKE OF RED MEAT, AND TO INCREASE FISH INTAKE TO TWO DAYS PER WEEK.  MEATS/FISH SHOULD NOT BE FRIED, BAKED OR BROILED IS PREFERABLE.  I SUGGEST WEARING SPF 50 SUNSCREEN ON EXPOSED PARTS AND ESPECIALLY WHEN IN THE DIRECT SUNLIGHT FOR AN EXTENDED PERIOD OF TIME.  PLEASE AVOID FAST FOOD  RESTAURANTS AND INCREASE YOUR WATER INTAKE.  - POCT Urinalysis Dipstick (81002) - CMP14+EGFR - CBC - Lipid panel - Hemoglobin A1c  2. Uncontrolled type 2 diabetes mellitus with hyperglycemia (HCC)  Diabetic foot exam was performed.  I DISCUSSED WITH THE PATIENT AT LENGTH REGARDING THE GOALS OF GLYCEMIC CONTROL AND POSSIBLE LONG-TERM COMPLICATIONS.  I  ALSO STRESSED THE IMPORTANCE OF COMPLIANCE WITH HOME GLUCOSE MONITORING, DIETARY RESTRICTIONS INCLUDING AVOIDANCE OF SUGARY DRINKS/PROCESSED FOODS,  ALONG WITH REGULAR EXERCISE.  I  ALSO STRESSED THE IMPORTANCE OF ANNUAL EYE EXAMS, SELF FOOT CARE AND COMPLIANCE WITH OFFICE VISITS.  - POCT UA - Microalbumin   3. Essential hypertension, benign  Chronic, well controlled. She will continue with current meds. She is encouraged to avoid adding salt to her foods. EKG performed, NSR w/ LAFB. No new changes noted.   - EKG 12-Lead  4. Tinea versicolor  She was advised to lather Selsun Blue shampoo over affected area, wait five minutes and then rinse x 7 days. She will let me know if her sx persist.   5. Need for influenza vaccination  She was given flu vaccine.    [name], MD    THE PATIENT IS ENCOURAGED TO PRACTICE SOCIAL DISTANCING DUE TO THE COVID-19 PANDEMIC.

## 2019-10-02 NOTE — Patient Instructions (Signed)
Selsun Blue shampoo - on chest Gold Bond Powder -- underneath breast   Health Maintenance, Female Adopting a healthy lifestyle and getting preventive care are important in promoting health and wellness. Ask your health care provider about:  The right schedule for you to have regular tests and exams.  Things you can do on your own to prevent diseases and keep yourself healthy. What should I know about diet, weight, and exercise? Eat a healthy diet   Eat a diet that includes plenty of vegetables, fruits, low-fat dairy products, and lean protein.  Do not eat a lot of foods that are high in solid fats, added sugars, or sodium. Maintain a healthy weight Body mass index (BMI) is used to identify weight problems. It estimates body fat based on height and weight. Your health care provider can help determine your BMI and help you achieve or maintain a healthy weight. Get regular exercise Get regular exercise. This is one of the most important things you can do for your health. Most adults should:  Exercise for at least 150 minutes each week. The exercise should increase your heart rate and make you sweat (moderate-intensity exercise).  Do strengthening exercises at least twice a week. This is in addition to the moderate-intensity exercise.  Spend less time sitting. Even light physical activity can be beneficial. Watch cholesterol and blood lipids Have your blood tested for lipids and cholesterol at 59 years of age, then have this test every 5 years. Have your cholesterol levels checked more often if:  Your lipid or cholesterol levels are high.  You are older than 59 years of age.  You are at high risk for heart disease. What should I know about cancer screening? Depending on your health history and family history, you may need to have cancer screening at various ages. This may include screening for:  Breast cancer.  Cervical cancer.  Colorectal cancer.  Skin cancer.  Lung  cancer. What should I know about heart disease, diabetes, and high blood pressure? Blood pressure and heart disease  High blood pressure causes heart disease and increases the risk of stroke. This is more likely to develop in people who have high blood pressure readings, are of African descent, or are overweight.  Have your blood pressure checked: ? Every 3-5 years if you are 62-52 years of age. ? Every year if you are 12 years old or older. Diabetes Have regular diabetes screenings. This checks your fasting blood sugar level. Have the screening done:  Once every three years after age 44 if you are at a normal weight and have a low risk for diabetes.  More often and at a younger age if you are overweight or have a high risk for diabetes. What should I know about preventing infection? Hepatitis B If you have a higher risk for hepatitis B, you should be screened for this virus. Talk with your health care provider to find out if you are at risk for hepatitis B infection. Hepatitis C Testing is recommended for:  Everyone born from [birth year] through 1966.  Anyone with known risk factors for hepatitis C. Sexually transmitted infections (STIs)  Get screened for STIs, including gonorrhea and chlamydia, if: ? You are sexually active and are younger than 59 years of age. ? You are older than 59 years of age and your health care provider tells you that you are at risk for this type of infection. ? Your sexual activity has changed since you were last screened, and you  are at increased risk for chlamydia or gonorrhea. Ask your health care provider if you are at risk.  Ask your health care provider about whether you are at high risk for HIV. Your health care provider may recommend a prescription medicine to help prevent HIV infection. If you choose to take medicine to prevent HIV, you should first get tested for HIV. You should then be tested every 3 months for as long as you are taking the medicine.  Pregnancy  If you are about to stop having your period (premenopausal) and you may become pregnant, seek counseling before you get pregnant.  Take 400 to 800 micrograms (mcg) of folic acid every day if you become pregnant.  Ask for birth control (contraception) if you want to prevent pregnancy. Osteoporosis and menopause Osteoporosis is a disease in which the bones lose minerals and strength with aging. This can result in bone fractures. If you are 32 years old or older, or if you are at risk for osteoporosis and fractures, ask your health care provider if you should:  Be screened for bone loss.  Take a calcium or vitamin D supplement to lower your risk of fractures.  Be given hormone replacement therapy (HRT) to treat symptoms of menopause. Follow these instructions at home: Lifestyle  Do not use any products that contain nicotine or tobacco, such as cigarettes, e-cigarettes, and chewing tobacco. If you need help quitting, ask your health care provider.  Do not use street drugs.  Do not share needles.  Ask your health care provider for help if you need support or information about quitting drugs. Alcohol use  Do not drink alcohol if: ? Your health care provider tells you not to drink. ? You are pregnant, may be pregnant, or are planning to become pregnant.  If you drink alcohol: ? Limit how much you use to 0-1 drink a day. ? Limit intake if you are breastfeeding.  Be aware of how much alcohol is in your drink. In the [place] one drink equals one 12 oz bottle of beer (355 mL), one 5 oz glass of wine (148 mL), or one 1 oz glass of hard liquor (44 mL). General instructions  Schedule regular health, dental, and eye exams.  Stay current with your vaccines.  Tell your health care provider if: ? You often feel depressed. ? You have ever been abused or do not feel safe at home. Summary  Adopting a healthy lifestyle and getting preventive care are important in promoting health  and wellness.  Follow your health care provider's instructions about healthy diet, exercising, and getting tested or screened for diseases.  Follow your health care provider's instructions on monitoring your cholesterol and blood pressure. This information is not intended to replace advice given to you by your health care provider. Make sure you discuss any questions you have with your health care provider. Document Released: 06/22/2011 Document Revised: 11/30/2018 Document Reviewed: 11/30/2018 Elsevier Patient Education  2020 [employer].

## 2019-10-03 ENCOUNTER — Other Ambulatory Visit: Payer: Self-pay | Admitting: Internal Medicine

## 2019-10-03 LAB — HEMOGLOBIN A1C
Est. average glucose Bld gHb Est-mCnc: 120 mg/dL
Hgb A1c MFr Bld: 5.8 % — ABNORMAL HIGH (ref 4.8–5.6)

## 2019-10-03 LAB — CBC
Hematocrit: 41.3 % (ref 34.0–46.6)
Hemoglobin: 13.2 g/dL (ref 11.1–15.9)
MCH: 26 pg — ABNORMAL LOW (ref 26.6–33.0)
MCHC: 32 g/dL (ref 31.5–35.7)
MCV: 82 fL (ref 79–97)
Platelets: 273 10*3/uL (ref 150–450)
RBC: 5.07 x10E6/uL (ref 3.77–5.28)
RDW: 13.7 % (ref 11.7–15.4)
WBC: 6.3 10*3/uL (ref 3.4–10.8)

## 2019-10-03 LAB — LIPID PANEL
Chol/HDL Ratio: 2.7 ratio (ref 0.0–4.4)
Cholesterol, Total: 161 mg/dL (ref 100–199)
HDL: 59 mg/dL (ref >39)
LDL Chol Calc (NIH): 79 mg/dL (ref 0–99)
Triglycerides: 133 mg/dL (ref 0–149)
VLDL Cholesterol Cal: 23 mg/dL (ref 5–40)

## 2019-10-03 LAB — CMP14+EGFR
ALT: 22 IU/L (ref 0–32)
AST: 24 IU/L (ref 0–40)
Albumin/Globulin Ratio: 1.9 (ref 1.2–2.2)
Albumin: 4.6 g/dL (ref 3.8–4.9)
Alkaline Phosphatase: 137 IU/L — ABNORMAL HIGH (ref 39–117)
BUN/Creatinine Ratio: 12 (ref 9–23)
BUN: 11 mg/dL (ref 6–24)
Bilirubin Total: 0.3 mg/dL (ref 0.0–1.2)
CO2: 23 mmol/L (ref 20–29)
Calcium: 9.5 mg/dL (ref 8.7–10.2)
Chloride: 103 mmol/L (ref 96–106)
Creatinine, Ser: 0.95 mg/dL (ref 0.57–1.00)
GFR calc Af Amer: 76 mL/min/{1.73_m2} (ref >59)
GFR calc non Af Amer: 66 mL/min/{1.73_m2} (ref >59)
Globulin, Total: 2.4 g/dL (ref 1.5–4.5)
Glucose: 90 mg/dL (ref 65–99)
Potassium: 4.3 mmol/L (ref 3.5–5.2)
Sodium: 141 mmol/L (ref 134–144)
Total Protein: 7 g/dL (ref 6.0–8.5)

## 2020-01-01 ENCOUNTER — Other Ambulatory Visit: Payer: Self-pay | Admitting: Internal Medicine

## 2020-02-05 ENCOUNTER — Ambulatory Visit: Payer: BC Managed Care – PPO | Admitting: Internal Medicine

## 2020-02-05 ENCOUNTER — Other Ambulatory Visit: Payer: Self-pay

## 2020-02-05 ENCOUNTER — Encounter: Payer: Self-pay | Admitting: Internal Medicine

## 2020-02-05 VITALS — BP 124/70 | HR 85 | Temp 98.6°F | Ht 66.4 in | Wt 196.6 lb

## 2020-02-05 DIAGNOSIS — Z6831 Body mass index (BMI) 31.0-31.9, adult: Secondary | ICD-10-CM | POA: Diagnosis not present

## 2020-02-05 DIAGNOSIS — E1165 Type 2 diabetes mellitus with hyperglycemia: Secondary | ICD-10-CM | POA: Diagnosis not present

## 2020-02-05 DIAGNOSIS — Z113 Encounter for screening for infections with a predominantly sexual mode of transmission: Secondary | ICD-10-CM

## 2020-02-05 DIAGNOSIS — E6609 Other obesity due to excess calories: Secondary | ICD-10-CM

## 2020-02-05 DIAGNOSIS — I10 Essential (primary) hypertension: Secondary | ICD-10-CM

## 2020-02-05 MED ORDER — LOSARTAN POTASSIUM 100 MG PO TABS: 100.0000 mg | ORAL_TABLET | Freq: Every day | ORAL | 1 refills | Status: DC

## 2020-02-06 LAB — HEMOGLOBIN A1C
Est. average glucose Bld gHb Est-mCnc: 123 mg/dL
Hgb A1c MFr Bld: 5.9 % — ABNORMAL HIGH (ref 4.8–5.6)

## 2020-02-06 LAB — HEPATITIS B SURFACE ANTIGEN: Hepatitis B Surface Ag: NEGATIVE

## 2020-02-06 LAB — CHLAMYDIA/GONOCOCCUS/TRICHOMONAS, NAA
Chlamydia by NAA: NEGATIVE
Gonococcus by NAA: NEGATIVE
Trich vag by NAA: NEGATIVE

## 2020-02-06 LAB — BMP8+EGFR
BUN/Creatinine Ratio: 14 (ref 9–23)
BUN: 15 mg/dL (ref 6–24)
CO2: 24 mmol/L (ref 20–29)
Calcium: 9.9 mg/dL (ref 8.7–10.2)
Chloride: 104 mmol/L (ref 96–106)
Creatinine, Ser: 1.08 mg/dL — ABNORMAL HIGH (ref 0.57–1.00)
GFR calc Af Amer: 65 mL/min/{1.73_m2} (ref >59)
GFR calc non Af Amer: 56 mL/min/{1.73_m2} — ABNORMAL LOW (ref >59)
Glucose: 94 mg/dL (ref 65–99)
Potassium: 4.4 mmol/L (ref 3.5–5.2)
Sodium: 142 mmol/L (ref 134–144)

## 2020-02-06 LAB — HIV ANTIBODY (ROUTINE TESTING W REFLEX): HIV Screen 4th Generation wRfx: NONREACTIVE

## 2020-02-06 LAB — HSV(HERPES SIMPLEX VRS) I + II AB-IGG
HSV 1 Glycoprotein G Ab, IgG: 20.2 index — ABNORMAL HIGH (ref 0.00–0.90)
HSV 2 IgG, Type Spec: 2.3 index — ABNORMAL HIGH (ref 0.00–0.90)

## 2020-02-06 LAB — HSV-2 IGG SUPPLEMENTAL TEST: HSV-2 IgG Supplemental Test: POSITIVE — AB

## 2020-02-06 LAB — RPR: RPR Ser Ql: NONREACTIVE

## 2020-02-06 NOTE — Progress Notes (Signed)
This visit occurred during the SARS-CoV-2 public health emergency.  Safety protocols were in place, including screening questions prior to the visit, additional usage of staff PPE, and extensive cleaning of exam room while observing appropriate contact time as indicated for disinfecting solutions.  Subjective:     Patient ID: [name] , female    DOB: Dec 29, 1960 , 58 y.o.   MRN: [id number]   Chief Complaint  Patient presents with  . Diabetes  . Hypertension    HPI  She is here today for diabetes/high blood pressure check. She is no longer taking Ozempic b/c she felt she lost too much weight.   Diabetes She presents for her follow-up diabetic visit. She has type 2 diabetes mellitus. Her disease course has been stable. There are no hypoglycemic associated symptoms. Pertinent negatives for hypoglycemia include no headaches. There are no diabetic associated symptoms. Pertinent negatives for diabetes include no blurred vision and no chest pain. There are no hypoglycemic complications. There are no diabetic complications. Risk factors for coronary artery disease include diabetes mellitus, dyslipidemia, hypertension, post-menopausal and sedentary lifestyle. She is compliant with treatment most of the time. She is following a diabetic diet.  Hypertension This is a chronic problem. The current episode started more than 1 year ago. The problem has been gradually improving since onset. The problem is controlled. Pertinent negatives include no blurred vision, chest pain, headaches, palpitations or shortness of breath.     Past Medical History:  Diagnosis Date  . Diabetes mellitus   . Hyperlipemia   . Hypertension   . Hypoxemia   . Obesity   . Vitamin D deficiency      Family History  Problem Relation Age of Onset  . Heart disease Mother   . Uterine cancer Mother   . Cancer Father   . Breast cancer Maternal Aunt   . Breast cancer Cousin   . Cancer Maternal Grandmother       Current Outpatient Medications:  .  amLODipine (NORVASC) 5 MG tablet, Take 1 tablet (5 mg total) by mouth daily., Disp: 90 tablet, Rfl: 1 .  aspirin EC 81 MG tablet, Take 81 mg by mouth daily., Disp: , Rfl:  .  atorvastatin (LIPITOR) 20 MG tablet, Take 1 tablet (20 mg total) by mouth daily., Disp: 90 tablet, Rfl: 1 .  Cholecalciferol (VITAMIN D3) 5000 units CAPS, Take by mouth., Disp: , Rfl:  .  fluticasone (FLONASE) 50 MCG/ACT nasal spray, SHAKE LIQUID AND USE 2 SPRAYS IN EACH NOSTRIL DAILY, Disp: 16 g, Rfl: 2 .  losartan (COZAAR) 100 MG tablet, Take 1 tablet (100 mg total) by mouth daily., Disp: 90 tablet, Rfl: 1   No Known Allergies   Review of Systems  Constitutional: Negative.   Eyes: Negative for blurred vision.  Respiratory: Negative.  Negative for shortness of breath.   Cardiovascular: Negative.  Negative for chest pain and palpitations.  Gastrointestinal: Negative.   Genitourinary:       She wants to have STD testing. She denies she is currently sexually active.   Neurological: Negative.  Negative for headaches.  Psychiatric/Behavioral: Negative.      Today's Vitals   02/05/20 1117  BP: 124/70  Pulse: 85  Temp: 98.6 F (37 C)  TempSrc: Oral  Weight: 196 lb 9.6 oz (89.2 kg)  Height: 5' 6.4" (1.687 m)   Body mass index is 31.35 kg/m.   Objective:  Physical Exam Vitals and nursing note reviewed.  Constitutional:      Appearance: Normal  appearance.  HENT:     Head: Normocephalic and atraumatic.  Cardiovascular:     Rate and Rhythm: Normal rate and regular rhythm.     Heart sounds: Normal heart sounds.  Pulmonary:     Effort: Pulmonary effort is normal.     Breath sounds: Normal breath sounds.  Skin:    General: Skin is warm.  Neurological:     General: No focal deficit present.     Mental Status: She is alert.  Psychiatric:        Mood and Affect: Mood normal.        Behavior: Behavior normal.         Assessment And Plan:     1. Uncontrolled  type 2 diabetes mellitus with hyperglycemia (HCC)  Chronic, I will check labs as listed below. She does not want to resume meds if possible. I will resume meds if a1c is greater than 7.0.  She is encouraged to incorporate more exercise into her daily routine.   - BMP8+EGFR - Hemoglobin A1c  2. Essential hypertension, benign  Chronic, well controlled. She will continue with current meds. She is encouraged to avoid adding salt to her foods.   3. Screening for STD (sexually transmitted disease)  I will check labs as listed below.   - Hepatitis B Surface Antigen - HIV antibody (with reflex) - RPR - HSV(herpes simplex vrs) 1+2 ab-IgG - Chlamydia/Gonococcus/Trichomonas, NAA  4. Class 1 obesity due to excess calories with serious comorbidity and body mass index (BMI) of 31.0 to 31.9 in adult  She is encouraged to strive for BMI less than 27 to decrease cardiac risk. She is advised to exercise no less than 30 minutes five days per week.  [name], MD    THE PATIENT IS ENCOURAGED TO PRACTICE SOCIAL DISTANCING DUE TO THE COVID-19 PANDEMIC.

## 2020-03-01 ENCOUNTER — Other Ambulatory Visit: Payer: Self-pay | Admitting: Internal Medicine

## 2020-03-02 ENCOUNTER — Other Ambulatory Visit: Payer: Self-pay | Admitting: Internal Medicine

## 2020-03-28 ENCOUNTER — Other Ambulatory Visit: Payer: Self-pay | Admitting: Internal Medicine

## 2020-04-11 ENCOUNTER — Encounter: Payer: Self-pay | Admitting: Internal Medicine

## 2020-04-11 ENCOUNTER — Telehealth: Payer: Self-pay

## 2020-04-11 NOTE — Telephone Encounter (Signed)
Called pt to advise her to go to urgent care or [facility] to be checked since she lives in [place]. Pt is understanding

## 2020-06-03 ENCOUNTER — Telehealth: Payer: Self-pay

## 2020-06-03 NOTE — Telephone Encounter (Signed)
PT CALLED LVM ADV [facility] THAT SHE HAS FOUND A NEW PROVIDER WHERE SHE LIVES. Arnoldo Hooker, MD IN [place] [facility]

## 2020-06-05 ENCOUNTER — Ambulatory Visit: Payer: BC Managed Care – PPO | Admitting: Internal Medicine

## 2020-06-25 ENCOUNTER — Other Ambulatory Visit: Payer: Self-pay | Admitting: Internal Medicine

## 2020-07-29 ENCOUNTER — Other Ambulatory Visit: Payer: Self-pay | Admitting: Internal Medicine

## 2020-08-24 ENCOUNTER — Other Ambulatory Visit: Payer: Self-pay | Admitting: Internal Medicine

## 2020-10-14 ENCOUNTER — Encounter: Payer: BC Managed Care – PPO | Admitting: Internal Medicine

## 2020-11-06 ENCOUNTER — Encounter: Payer: Self-pay | Admitting: Internal Medicine

## 2020-11-06 ENCOUNTER — Telehealth: Payer: Self-pay

## 2020-11-06 NOTE — Telephone Encounter (Signed)
error 

## 2020-11-28 ENCOUNTER — Ambulatory Visit: Payer: BC Managed Care – PPO | Admitting: Internal Medicine

## 2020-11-28 ENCOUNTER — Other Ambulatory Visit: Payer: Self-pay

## 2020-11-28 ENCOUNTER — Encounter: Payer: Self-pay | Admitting: Internal Medicine

## 2020-11-28 VITALS — BP 114/76 | HR 62 | Temp 98.4°F | Ht 66.4 in | Wt 194.0 lb

## 2020-11-28 DIAGNOSIS — E1165 Type 2 diabetes mellitus with hyperglycemia: Secondary | ICD-10-CM

## 2020-11-28 DIAGNOSIS — E6609 Other obesity due to excess calories: Secondary | ICD-10-CM | POA: Diagnosis not present

## 2020-11-28 DIAGNOSIS — Z23 Encounter for immunization: Secondary | ICD-10-CM | POA: Diagnosis not present

## 2020-11-28 DIAGNOSIS — F419 Anxiety disorder, unspecified: Secondary | ICD-10-CM | POA: Diagnosis not present

## 2020-11-28 DIAGNOSIS — I10 Essential (primary) hypertension: Secondary | ICD-10-CM | POA: Diagnosis not present

## 2020-11-28 DIAGNOSIS — Z6830 Body mass index (BMI) 30.0-30.9, adult: Secondary | ICD-10-CM

## 2020-11-28 DIAGNOSIS — Z79899 Other long term (current) drug therapy: Secondary | ICD-10-CM

## 2020-11-28 MED ORDER — LOSARTAN POTASSIUM 100 MG PO TABS: 100.0000 mg | ORAL_TABLET | Freq: Every day | ORAL | 1 refills | Status: DC

## 2020-11-28 MED ORDER — FLUTICASONE PROPIONATE 50 MCG/ACT NA SUSP: 1.0000 | Freq: Every day | NASAL | 2 refills | Status: DC

## 2020-11-28 NOTE — Patient Instructions (Addendum)
Calm, magnesium supplement One tsp at night  Diabetes Mellitus and Foot Care Foot care is an important part of your health, especially when you have diabetes. Diabetes may cause you to have problems because of poor blood flow (circulation) to your feet and legs, which can cause your skin to:  Become thinner and drier.  Break more easily.  Heal more slowly.  Peel and crack. You may also have nerve damage (neuropathy) in your legs and feet, causing decreased feeling in them. This means that you may not notice minor injuries to your feet that could lead to more serious problems. Noticing and addressing any potential problems early is the best way to prevent future foot problems. How to care for your feet Foot hygiene  Wash your feet daily with warm water and mild soap. Do not use hot water. Then, pat your feet and the areas between your toes until they are completely dry. Do not soak your feet as this can dry your skin.  Trim your toenails straight across. Do not dig under them or around the cuticle. File the edges of your nails with an emery board or nail file.  Apply a moisturizing lotion or petroleum jelly to the skin on your feet and to dry, brittle toenails. Use lotion that does not contain alcohol and is unscented. Do not apply lotion between your toes. Shoes and socks  Wear clean socks or stockings every day. Make sure they are not too tight. Do not wear knee-high stockings since they may decrease blood flow to your legs.  Wear shoes that fit properly and have enough cushioning. Always look in your shoes before you put them on to be sure there are no objects inside.  To break in new shoes, wear them for just a few hours a day. This prevents injuries on your feet. Wounds, scrapes, corns, and calluses  Check your feet daily for blisters, cuts, bruises, sores, and redness. If you cannot see the bottom of your feet, use a mirror or ask someone for help.  Do not cut corns or calluses  or try to remove them with medicine.  If you find a minor scrape, cut, or break in the skin on your feet, keep it and the skin around it clean and dry. You may clean these areas with mild soap and water. Do not clean the area with peroxide, alcohol, or iodine.  If you have a wound, scrape, corn, or callus on your foot, look at it several times a day to make sure it is healing and not infected. Check for: ? Redness, swelling, or pain. ? Fluid or blood. ? Warmth. ? Pus or a bad smell. General instructions  Do not cross your legs. This may decrease blood flow to your feet.  Do not use heating pads or hot water bottles on your feet. They may burn your skin. If you have lost feeling in your feet or legs, you may not know this is happening until it is too late.  Protect your feet from hot and cold by wearing shoes, such as at the beach or on hot pavement.  Schedule a complete foot exam at least once a year (annually) or more often if you have foot problems. If you have foot problems, report any cuts, sores, or bruises to your health care provider immediately. Contact a health care provider if:  You have a medical condition that increases your risk of infection and you have any cuts, sores, or bruises on your feet.  You have an injury that is not healing.  You have redness on your legs or feet.  You feel burning or tingling in your legs or feet.  You have pain or cramps in your legs and feet.  Your legs or feet are numb.  Your feet always feel cold.  You have pain around a toenail. Get help right away if:  You have a wound, scrape, corn, or callus on your foot and: ? You have pain, swelling, or redness that gets worse. ? You have fluid or blood coming from the wound, scrape, corn, or callus. ? Your wound, scrape, corn, or callus feels warm to the touch. ? You have pus or a bad smell coming from the wound, scrape, corn, or callus. ? You have a fever. ? You have a red line going up  your leg. Summary  Check your feet every day for cuts, sores, red spots, swelling, and blisters.  Moisturize feet and legs daily.  Wear shoes that fit properly and have enough cushioning.  If you have foot problems, report any cuts, sores, or bruises to your health care provider immediately.  Schedule a complete foot exam at least once a year (annually) or more often if you have foot problems. This information is not intended to replace advice given to you by your health care provider. Make sure you discuss any questions you have with your health care provider. Document Revised: 08/30/2019 Document Reviewed: 01/08/2017 Elsevier Patient Education  [employer].

## 2020-11-28 NOTE — Progress Notes (Signed)
I,Katawbba Wiggins,acting as a Education administrator for [name], MD.,have documented all relevant documentation on the behalf of [name], MD,as directed by  [name], MD while in the presence of [name], MD.  This visit occurred during the SARS-CoV-2 public health emergency.  Safety protocols were in place, including screening questions prior to the visit, additional usage of staff PPE, and extensive cleaning of exam room while observing appropriate contact time as indicated for disinfecting solutions.  Subjective:     Patient ID: [name] , female    DOB: 07-21-1961 , 59 y.o.   MRN: [id number]   Chief Complaint  Patient presents with  . Diabetes  . Hypertension    HPI  She is here today for diabetes/high blood pressure check. She has moved to the [place] area, and had transferred her care to [place]. However, wishes to return here for her care. She reports her diabetes is now controlled off of meds. She has settled into her new home.   Diabetes She presents for her follow-up diabetic visit. She has type 2 diabetes mellitus. Her disease course has been stable. Hypoglycemia symptoms include nervousness/anxiousness. Pertinent negatives for hypoglycemia include no headaches. There are no diabetic associated symptoms. Pertinent negatives for diabetes include no blurred vision and no chest pain. There are no hypoglycemic complications. There are no diabetic complications. Risk factors for coronary artery disease include diabetes mellitus, dyslipidemia, hypertension, post-menopausal and sedentary lifestyle. She is compliant with treatment most of the time. She is following a diabetic diet.  Hypertension This is a chronic problem. The current episode started more than 1 year ago. The problem has been gradually improving since onset. The problem is controlled. Pertinent negatives include no blurred vision, chest pain, headaches, palpitations or shortness of breath.     Past  Medical History:  Diagnosis Date  . Diabetes mellitus   . Hyperlipemia   . Hypertension   . Hypoxemia   . Obesity   . Vitamin D deficiency      Family History  Problem Relation Age of Onset  . Heart disease Mother   . Uterine cancer Mother   . Cancer Father   . Breast cancer Maternal Aunt   . Breast cancer Cousin   . Cancer Maternal Grandmother      Current Outpatient Medications:  .  amLODipine (NORVASC) 5 MG tablet, TAKE 1 TABLET(5 MG) BY MOUTH DAILY, Disp: 90 tablet, Rfl: 1 .  aspirin EC 81 MG tablet, Take 81 mg by mouth daily., Disp: , Rfl:  .  atorvastatin (LIPITOR) 20 MG tablet, TAKE 1 TABLET BY MOUTH EVERY DAY, Disp: 90 tablet, Rfl: 1 .  Cholecalciferol (VITAMIN D3) 5000 units CAPS, Take by mouth., Disp: , Rfl:  .  fluticasone (FLONASE) 50 MCG/ACT nasal spray, Place 1 spray into both nostrils daily., Disp: 16 g, Rfl: 2 .  losartan (COZAAR) 100 MG tablet, Take 1 tablet (100 mg total) by mouth daily., Disp: 90 tablet, Rfl: 1   No Known Allergies   Review of Systems  Constitutional: Negative.   Eyes: Negative for blurred vision.  Respiratory: Negative.  Negative for shortness of breath.   Cardiovascular: Negative.  Negative for chest pain and palpitations.  Gastrointestinal: Negative.   Neurological: Negative.  Negative for headaches.  Psychiatric/Behavioral: The patient is nervous/anxious.        States she is feeling more anxious. Admits the transition to her new job was stressful. Unfortunately, she had to work during the summer. She has  not had any time off. She admits stress is contributing to her sx.      Today's Vitals   11/28/20 1415  BP: 114/76  Pulse: 62  Temp: 98.4 F (36.9 C)  TempSrc: Oral  Weight: 194 lb (88 kg)  Height: 5' 6.4" (1.687 m)   Body mass index is 30.94 kg/m.  Wt Readings from Last 3 Encounters:  11/28/20 194 lb (88 kg)  02/05/20 196 lb 9.6 oz (89.2 kg)  10/02/19 186 lb 9.6 oz (84.6 kg)   Objective:  Physical Exam Vitals and  nursing note reviewed.  Constitutional:      Appearance: Normal appearance.  HENT:     Head: Normocephalic and atraumatic.  Cardiovascular:     Rate and Rhythm: Normal rate and regular rhythm.     Heart sounds: Normal heart sounds.  Pulmonary:     Effort: Pulmonary effort is normal.     Breath sounds: Normal breath sounds.  Skin:    General: Skin is warm.  Neurological:     General: No focal deficit present.     Mental Status: She is alert.  Psychiatric:        Mood and Affect: Mood normal.        Behavior: Behavior normal.         Assessment And Plan:     1. Uncontrolled type 2 diabetes mellitus with hyperglycemia (HCC) Comments: Chronic, I will check labs as listed below. She was congratulated on her lifestyle changes to get her sugars under control.  - Hemoglobin A1c - CMP14+EGFR  2. Essential hypertension, benign Comments: Chronic, well controlled. She will continue with current meds. She is encouraged to avoid adding salt to her foods. Labs from [facility] provider reviewed in detail  3. Anxiety Comments: New onset. She is advised to consider magnesium supplementation. ADvised to start with 250-445m nightly.  - Magnesium  4. Class 1 obesity due to excess calories with serious comorbidity and body mass index (BMI) of 30.0 to 30.9 in adult Comments: She is encouraged to aim for at least 150 minutes of exercise per week.   5. Drug therapy - Vitamin B12  6. Need for vaccination Comments: She was given flu vaccine to update her immunization history.  - Flu Vaccine QUAD 6+ mos PF IM (Fluarix Quad PF)   Patient was given opportunity to ask questions. Patient verbalized understanding of the plan and was able to repeat key elements of the plan. All questions were answered to their satisfaction.  RMaximino Greenland MD   I, RMaximino Greenland MD, have reviewed all documentation for this visit. The documentation on 11/30/20 for the exam, diagnosis, procedures, and orders are all  accurate and complete.  THE PATIENT IS ENCOURAGED TO PRACTICE SOCIAL DISTANCING DUE TO THE COVID-19 PANDEMIC.

## 2020-11-29 ENCOUNTER — Encounter: Payer: Self-pay | Admitting: Internal Medicine

## 2020-11-29 LAB — CMP14+EGFR
ALT: 26 IU/L (ref 0–32)
AST: 22 IU/L (ref 0–40)
Albumin/Globulin Ratio: 1.6 (ref 1.2–2.2)
Albumin: 4.6 g/dL (ref 3.8–4.9)
Alkaline Phosphatase: 125 IU/L — ABNORMAL HIGH (ref 44–121)
BUN/Creatinine Ratio: 16 (ref 9–23)
BUN: 14 mg/dL (ref 6–24)
Bilirubin Total: 0.3 mg/dL (ref 0.0–1.2)
CO2: 25 mmol/L (ref 20–29)
Calcium: 9.7 mg/dL (ref 8.7–10.2)
Chloride: 107 mmol/L — ABNORMAL HIGH (ref 96–106)
Creatinine, Ser: 0.9 mg/dL (ref 0.57–1.00)
GFR calc Af Amer: 81 mL/min/{1.73_m2} (ref >59)
GFR calc non Af Amer: 70 mL/min/{1.73_m2} (ref >59)
Globulin, Total: 2.9 g/dL (ref 1.5–4.5)
Glucose: 88 mg/dL (ref 65–99)
Potassium: 4.4 mmol/L (ref 3.5–5.2)
Sodium: 146 mmol/L — ABNORMAL HIGH (ref 134–144)
Total Protein: 7.5 g/dL (ref 6.0–8.5)

## 2020-11-29 LAB — VITAMIN B12: Vitamin B-12: 536 pg/mL (ref 232–1245)

## 2020-11-29 LAB — MAGNESIUM: Magnesium: 2.2 mg/dL (ref 1.6–2.3)

## 2020-11-29 LAB — HEMOGLOBIN A1C
Est. average glucose Bld gHb Est-mCnc: 126 mg/dL
Hgb A1c MFr Bld: 6 % — ABNORMAL HIGH (ref 4.8–5.6)

## 2020-12-09 LAB — HM PAP SMEAR

## 2021-01-01 LAB — HM DIABETES EYE EXAM

## 2021-01-03 ENCOUNTER — Encounter: Payer: Self-pay | Admitting: Internal Medicine

## 2021-02-21 ENCOUNTER — Other Ambulatory Visit: Payer: Self-pay | Admitting: Internal Medicine

## 2021-03-31 ENCOUNTER — Other Ambulatory Visit: Payer: Self-pay

## 2021-03-31 ENCOUNTER — Telehealth: Payer: BC Managed Care – PPO | Admitting: Internal Medicine

## 2021-03-31 ENCOUNTER — Encounter: Payer: Self-pay | Admitting: Internal Medicine

## 2021-03-31 VITALS — Ht 66.4 in | Wt 198.0 lb

## 2021-03-31 DIAGNOSIS — I10 Essential (primary) hypertension: Secondary | ICD-10-CM

## 2021-03-31 DIAGNOSIS — E1169 Type 2 diabetes mellitus with other specified complication: Secondary | ICD-10-CM

## 2021-03-31 DIAGNOSIS — F17210 Nicotine dependence, cigarettes, uncomplicated: Secondary | ICD-10-CM | POA: Diagnosis not present

## 2021-03-31 DIAGNOSIS — Z86010 Personal history of colonic polyps: Secondary | ICD-10-CM

## 2021-03-31 DIAGNOSIS — E785 Hyperlipidemia, unspecified: Secondary | ICD-10-CM | POA: Diagnosis not present

## 2021-03-31 MED ORDER — LOSARTAN POTASSIUM 100 MG PO TABS
100.0000 mg | ORAL_TABLET | Freq: Every day | ORAL | 1 refills | Status: DC
Start: 2021-03-31 — End: 2021-12-19

## 2021-03-31 MED ORDER — AMLODIPINE BESYLATE 5 MG PO TABS
ORAL_TABLET | ORAL | 1 refills | Status: DC
Start: 2021-03-31 — End: 2021-05-21

## 2021-03-31 NOTE — Progress Notes (Signed)
Virtual Visit via Video   This visit type was conducted due to national recommendations for restrictions regarding the COVID-19 Pandemic (e.g. social distancing) in an effort to limit this patient's exposure and mitigate transmission in our community.  Due to her co-morbid illnesses, this patient is at least at moderate risk for complications without adequate follow up.  This format is felt to be most appropriate for this patient at this time.  All issues noted in this document were discussed and addressed.  A limited physical exam was performed with this format.    This visit type was conducted due to national recommendations for restrictions regarding the COVID-19 Pandemic (e.g. social distancing) in an effort to limit this patient's exposure and mitigate transmission in our community.  Patients identity confirmed using two different identifiers.  This format is felt to be most appropriate for this patient at this time.  All issues noted in this document were discussed and addressed.  No physical exam was performed (except for noted visual exam findings with Video Visits).    Date:  03/31/2021   ID:  [name], DOB 1961/04/01, MRN [id number]  Patient Location:  Home  Provider location:   Office    Chief Complaint:  "I have diabetes check"  History of Present Illness:    [name] is a 60 y.o. female who presents via video conferencing for a telehealth visit today.    The patient does have symptoms concerning for COVID-19 infection (fever, chills, cough, or new shortness of breath).   She presents today for virtual visit. She prefers this method of contact due to COVID-19 pandemic.She presents today for dm/htn check. She has not needed to take meds for diabetes in the past year. She prefers to continue to control this with diet/exercise. Reports she has been walking regularly.   Diabetes She presents for her follow-up diabetic visit. She has type 2 diabetes mellitus. Her  disease course has been stable. There are no hypoglycemic associated symptoms. Pertinent negatives for hypoglycemia include no headaches. There are no diabetic associated symptoms. Pertinent negatives for diabetes include no blurred vision and no chest pain. There are no hypoglycemic complications. There are no diabetic complications. Risk factors for coronary artery disease include diabetes mellitus, dyslipidemia, hypertension, post-menopausal and sedentary lifestyle. She is compliant with treatment most of the time. She is following a diabetic diet. She participates in exercise three times a week. She does not see a podiatrist.Eye exam is current.  Hypertension This is a chronic problem. The current episode started more than 1 year ago. The problem has been gradually improving since onset. The problem is controlled. Pertinent negatives include no blurred vision, chest pain, headaches, palpitations or shortness of breath.     Past Medical History:  Diagnosis Date  . Diabetes mellitus   . Hyperlipemia   . Hypertension   . Hypoxemia   . Obesity   . Vitamin D deficiency    Past Surgical History:  Procedure Laterality Date  . ABDOMINAL HYSTERECTOMY       Current Meds  Medication Sig  . aspirin EC 81 MG tablet Take 81 mg by mouth daily.  Marland Kitchen atorvastatin (LIPITOR) 20 MG tablet TAKE 1 TABLET BY MOUTH EVERY DAY  . Cholecalciferol (VITAMIN D3) 5000 units CAPS Take by mouth.  . fluticasone (FLONASE) 50 MCG/ACT nasal spray Place 1 spray into both nostrils daily.  . [DISCONTINUED] amLODipine (NORVASC) 5 MG tablet TAKE 1 TABLET(5 MG) BY MOUTH DAILY  . [DISCONTINUED] losartan (COZAAR) 100  MG tablet Take 1 tablet (100 mg total) by mouth daily.     Allergies:   Patient has no known allergies.   Social History   Tobacco Use  . Smoking status: Former Smoker    Packs/day: 1.00    Years: 30.00    Pack years: 30.00    Start date: 26    Quit date: 2016    Years since quitting: 6.2  . Smokeless  tobacco: Never Used  Vaping Use  . Vaping Use: Never used  Substance Use Topics  . Alcohol use: Yes    Alcohol/week: 0.0 standard drinks  . Drug use: No     Family Hx: The patient's family history includes Breast cancer in her cousin and maternal aunt; Cancer in her father and maternal grandmother; Heart disease in her mother; Uterine cancer in her mother.  ROS:   Please see the history of present illness.    Review of Systems  Constitutional: Negative.   Eyes: Negative for blurred vision.  Respiratory: Negative.  Negative for shortness of breath.   Cardiovascular: Negative.  Negative for chest pain and palpitations.  Gastrointestinal: Negative.   Neurological: Negative.  Negative for headaches.  Psychiatric/Behavioral: Negative.     All other systems reviewed and are negative.   Labs/Other Tests and Data Reviewed:    Recent Labs: 11/28/2020: ALT 26; BUN 14; Creatinine, Ser 0.90; Magnesium 2.2; Potassium 4.4; Sodium 146   Recent Lipid Panel Lab Results  Component Value Date/Time   CHOL 161 10/02/2019 12:45 PM   TRIG 133 10/02/2019 12:45 PM   HDL 59 10/02/2019 12:45 PM   CHOLHDL 2.7 10/02/2019 12:45 PM   LDLCALC 79 10/02/2019 12:45 PM    Wt Readings from Last 3 Encounters:  03/31/21 198 lb (89.8 kg)  11/28/20 194 lb (88 kg)  02/05/20 196 lb 9.6 oz (89.2 kg)     Exam:    Vital Signs:  Ht 5' 6.4" (1.687 m)   Wt 198 lb (89.8 kg) Comment: pt provided  BMI 31.57 kg/m     Physical Exam Vitals and nursing note reviewed.  Constitutional:      Appearance: Normal appearance.  HENT:     Head: Normocephalic and atraumatic.  Pulmonary:     Effort: Pulmonary effort is normal.  Musculoskeletal:     Cervical back: Normal range of motion.  Neurological:     Mental Status: She is alert and oriented to person, place, and time.  Psychiatric:        Mood and Affect: Affect normal.     ASSESSMENT & PLAN:    1. Type 2 diabetes mellitus with hyperlipidemia  ([place]) Comments: She has been able to control her sugars off of meds. She plans to be in [place] later this week, I will check labs at that time.  - BMP8+EGFR; Future - Hemoglobin A1c; Future  2. Essential hypertension, benign Comments: She will c/w losartan and amlodipine. She will f/u in 4 months for her yearly physical exam.   3. Personal history of colonic polyps Comments: I will refer her to GI for CRC screening. Last colonoscopy was in 2017.  - Ambulatory referral to Gastroenterology  4. Cigarette nicotine dependence without complication Comments: She agrees to low dose CT screen for lung cancer.  - CT CHEST LUNG CA SCREEN LOW DOSE W/O CM; Future    COVID-19 Education: The signs and symptoms of COVID-19 were discussed with the patient and how to seek care for testing (follow up with PCP or arrange  E-visit).  The importance of social distancing was discussed today.  Patient Risk:   After full review of this patients clinical status, I feel that they are at least moderate risk at this time.  Time:   Today, I have spent 18 minutes/ seconds with the patient with telehealth technology discussing above diagnoses.     Medication Adjustments/Labs and Tests Ordered: Current medicines are reviewed at length with the patient today.  Concerns regarding medicines are outlined above.   Tests Ordered: Orders Placed This Encounter  Procedures  . CT CHEST LUNG CA SCREEN LOW DOSE W/O CM  . BMP8+EGFR  . Hemoglobin A1c  . Ambulatory referral to Gastroenterology    Medication Changes: Meds ordered this encounter  Medications  . amLODipine (NORVASC) 5 MG tablet    Sig: TAKE 1 TABLET(5 MG) BY MOUTH DAILY    Dispense:  90 tablet    Refill:  1    Disposition:  Follow up in 4 month(s)  Signed, [name], MD

## 2021-03-31 NOTE — Patient Instructions (Signed)
Diabetes Mellitus and Foot Care Foot care is an important part of your health, especially when you have diabetes. Diabetes may cause you to have problems because of poor blood flow (circulation) to your feet and legs, which can cause your skin to:  Become thinner and drier.  Break more easily.  Heal more slowly.  Peel and crack. You may also have nerve damage (neuropathy) in your legs and feet, causing decreased feeling in them. This means that you may not notice minor injuries to your feet that could lead to more serious problems. Noticing and addressing any potential problems early is the best way to prevent future foot problems. How to care for your feet Foot hygiene  Wash your feet daily with warm water and mild soap. Do not use hot water. Then, pat your feet and the areas between your toes until they are completely dry. Do not soak your feet as this can dry your skin.  Trim your toenails straight across. Do not dig under them or around the cuticle. File the edges of your nails with an emery board or nail file.  Apply a moisturizing lotion or petroleum jelly to the skin on your feet and to dry, brittle toenails. Use lotion that does not contain alcohol and is unscented. Do not apply lotion between your toes.   Shoes and socks  Wear clean socks or stockings every day. Make sure they are not too tight. Do not wear knee-high stockings since they may decrease blood flow to your legs.  Wear shoes that fit properly and have enough cushioning. Always look in your shoes before you put them on to be sure there are no objects inside.  To break in new shoes, wear them for just a few hours a day. This prevents injuries on your feet. Wounds, scrapes, corns, and calluses  Check your feet daily for blisters, cuts, bruises, sores, and redness. If you cannot see the bottom of your feet, use a mirror or ask someone for help.  Do not cut corns or calluses or try to remove them with medicine.  If you  find a minor scrape, cut, or break in the skin on your feet, keep it and the skin around it clean and dry. You may clean these areas with mild soap and water. Do not clean the area with peroxide, alcohol, or iodine.  If you have a wound, scrape, corn, or callus on your foot, look at it several times a day to make sure it is healing and not infected. Check for: ? Redness, swelling, or pain. ? Fluid or blood. ? Warmth. ? Pus or a bad smell.   General tips  Do not cross your legs. This may decrease blood flow to your feet.  Do not use heating pads or hot water bottles on your feet. They may burn your skin. If you have lost feeling in your feet or legs, you may not know this is happening until it is too late.  Protect your feet from hot and cold by wearing shoes, such as at the beach or on hot pavement.  Schedule a complete foot exam at least once a year (annually) or more often if you have foot problems. Report any cuts, sores, or bruises to your health care provider immediately. Where to find more information  American Diabetes Association: www.diabetes.org  Association of Diabetes Care & Education Specialists: www.diabeteseducator.org Contact a health care provider if:  You have a medical condition that increases your risk of infection and   you have any cuts, sores, or bruises on your feet.  You have an injury that is not healing.  You have redness on your legs or feet.  You feel burning or tingling in your legs or feet.  You have pain or cramps in your legs and feet.  Your legs or feet are numb.  Your feet always feel cold.  You have pain around any toenails. Get help right away if:  You have a wound, scrape, corn, or callus on your foot and: ? You have pain, swelling, or redness that gets worse. ? You have fluid or blood coming from the wound, scrape, corn, or callus. ? Your wound, scrape, corn, or callus feels warm to the touch. ? You have pus or a bad smell coming from  the wound, scrape, corn, or callus. ? You have a fever. ? You have a red line going up your leg. Summary  Check your feet every day for blisters, cuts, bruises, sores, and redness.  Apply a moisturizing lotion or petroleum jelly to the skin on your feet and to dry, brittle toenails.  Wear shoes that fit properly and have enough cushioning.  If you have foot problems, report any cuts, sores, or bruises to your health care provider immediately.  Schedule a complete foot exam at least once a year (annually) or more often if you have foot problems. This information is not intended to replace advice given to you by your health care provider. Make sure you discuss any questions you have with your health care provider. Document Revised: 06/27/2020 Document Reviewed: 06/27/2020 Elsevier Patient Education  2021 [employer]  

## 2021-04-02 ENCOUNTER — Other Ambulatory Visit: Payer: Self-pay

## 2021-04-02 DIAGNOSIS — E1169 Type 2 diabetes mellitus with other specified complication: Secondary | ICD-10-CM

## 2021-04-03 LAB — BMP8+EGFR
BUN/Creatinine Ratio: 12 (ref 12–28)
BUN: 12 mg/dL (ref 8–27)
CO2: 22 mmol/L (ref 20–29)
Calcium: 9.8 mg/dL (ref 8.7–10.3)
Chloride: 103 mmol/L (ref 96–106)
Creatinine, Ser: 0.98 mg/dL (ref 0.57–1.00)
Glucose: 90 mg/dL (ref 65–99)
Potassium: 4.3 mmol/L (ref 3.5–5.2)
Sodium: 140 mmol/L (ref 134–144)
eGFR: 66 mL/min/{1.73_m2} (ref >59)

## 2021-04-03 LAB — HEMOGLOBIN A1C
Est. average glucose Bld gHb Est-mCnc: 117 mg/dL
Hgb A1c MFr Bld: 5.7 % — ABNORMAL HIGH (ref 4.8–5.6)

## 2021-04-18 IMAGING — MG DIGITAL SCREENING BILATERAL MAMMOGRAM WITH TOMO AND CAD
8 series · 8 of 24 positions shown · non-contrast
Comparison: Previous exam(s).

CLINICAL DATA: Screening.

EXAM:
DIGITAL SCREENING BILATERAL MAMMOGRAM WITH TOMO AND CAD

[L CC synth-2D]
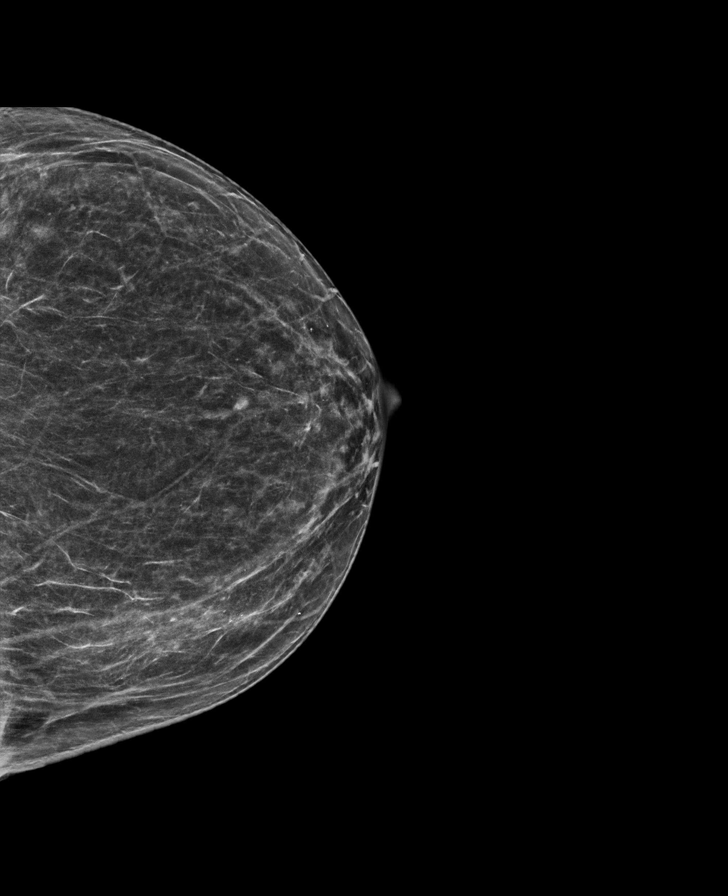

[L MLO synth-2D]
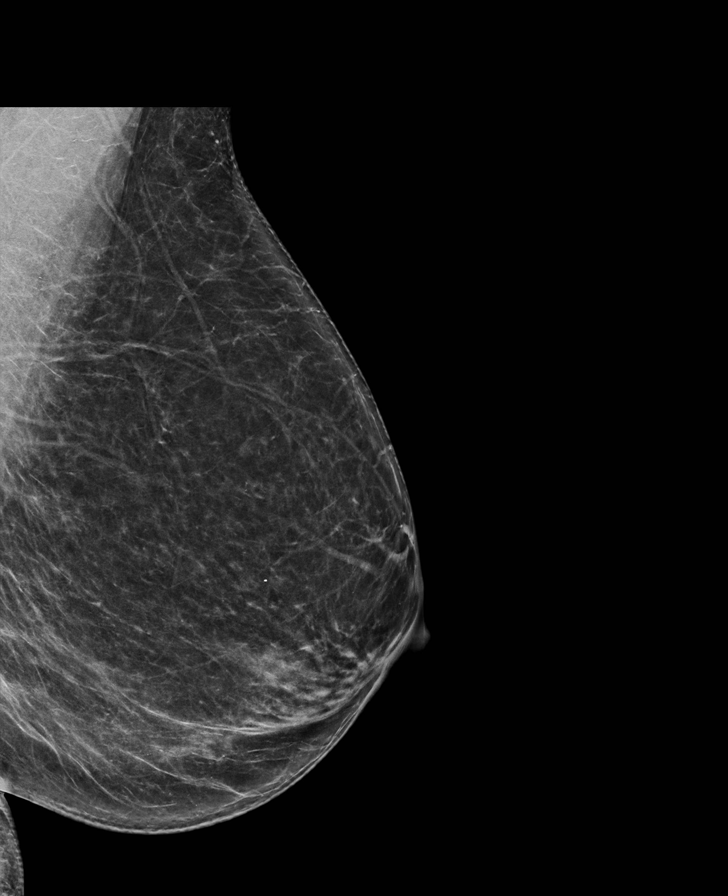

[R CC synth-2D]
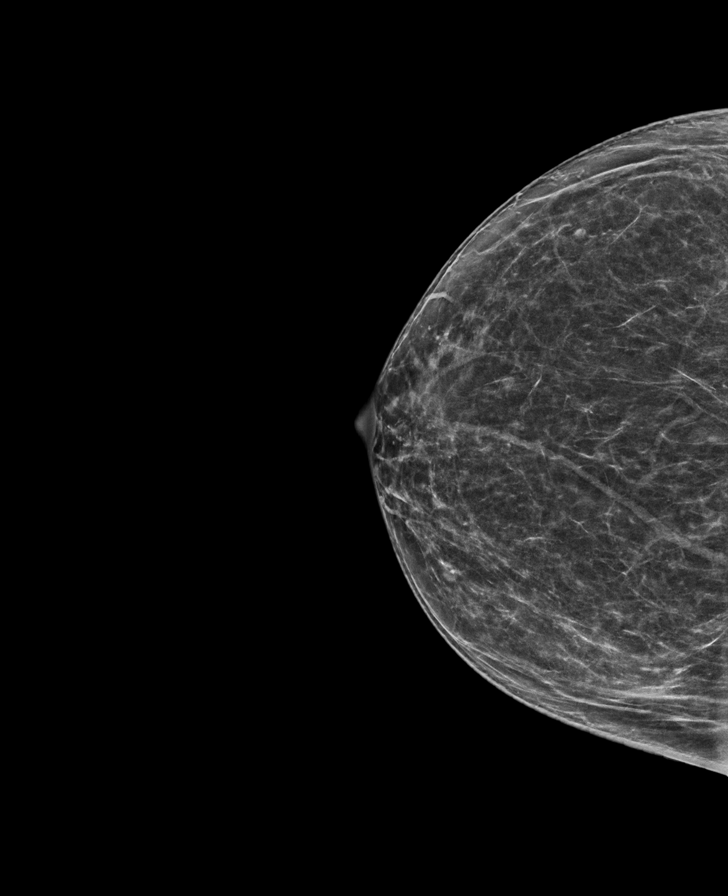

[R MLO synth-2D]
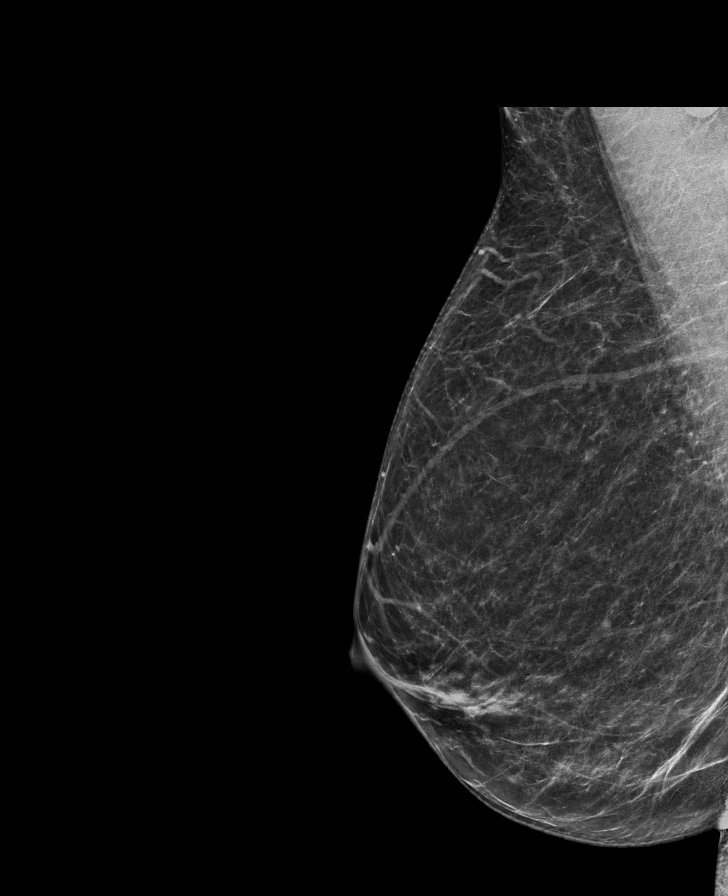

[R MLO tomo · tomo slice 37/72.0]
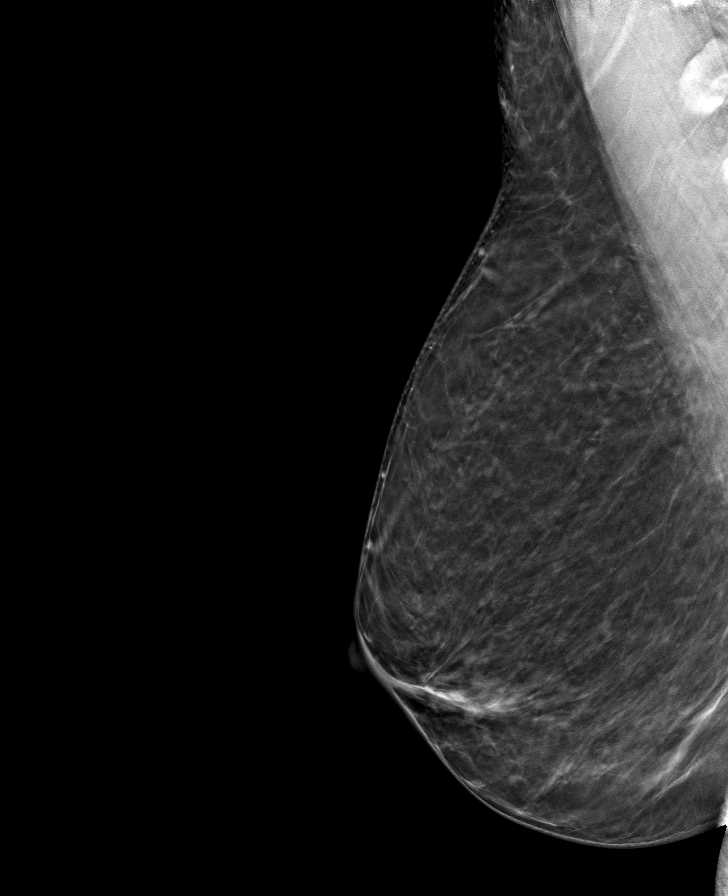

[L MLO tomo · tomo slice 38/75.0]
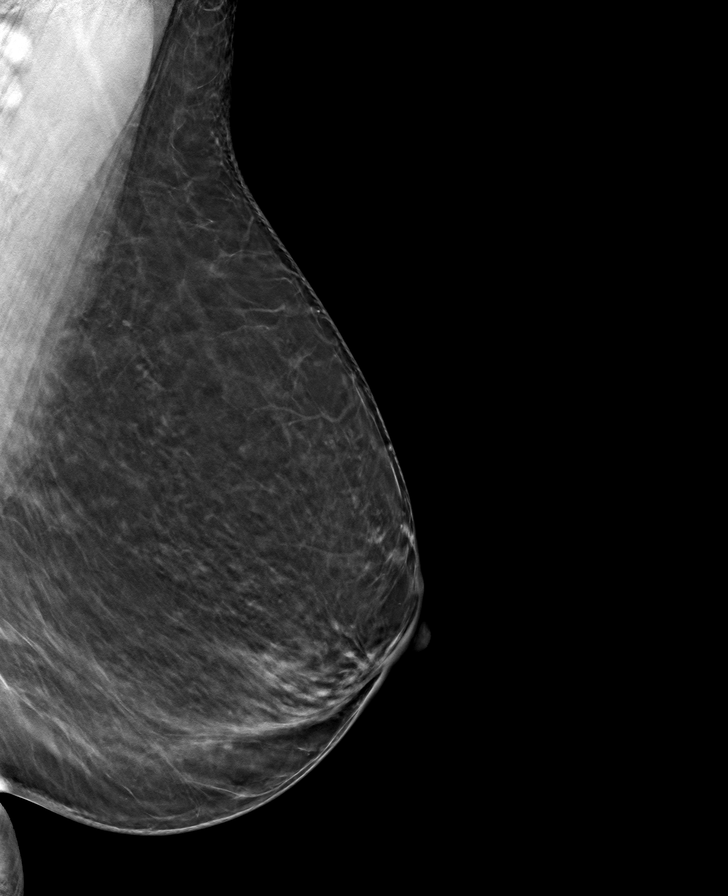

[L CC tomo · tomo slice 35/68.0]
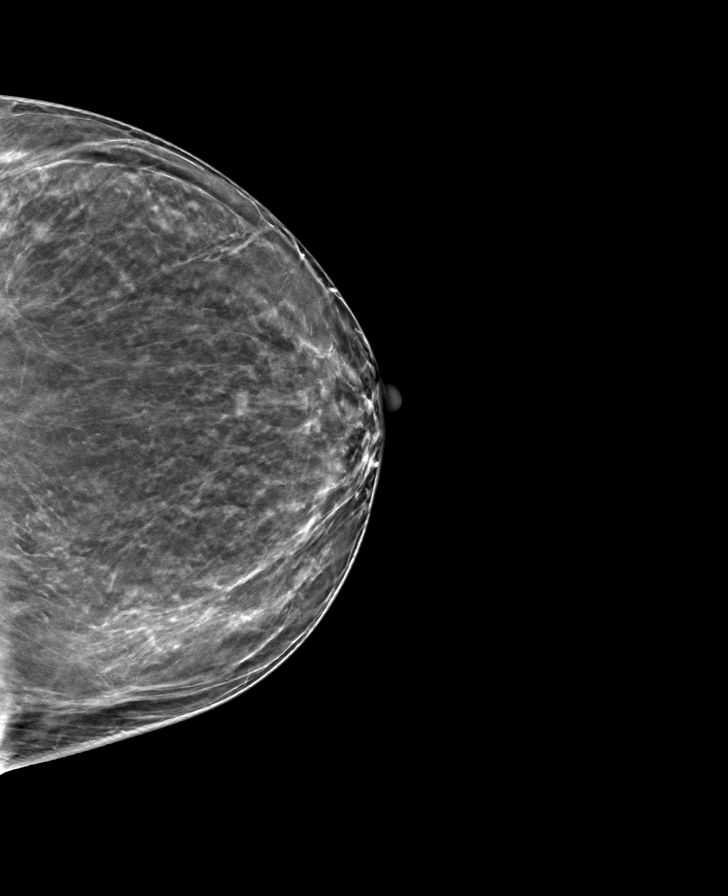

[R CC tomo · tomo slice 33/66.0]
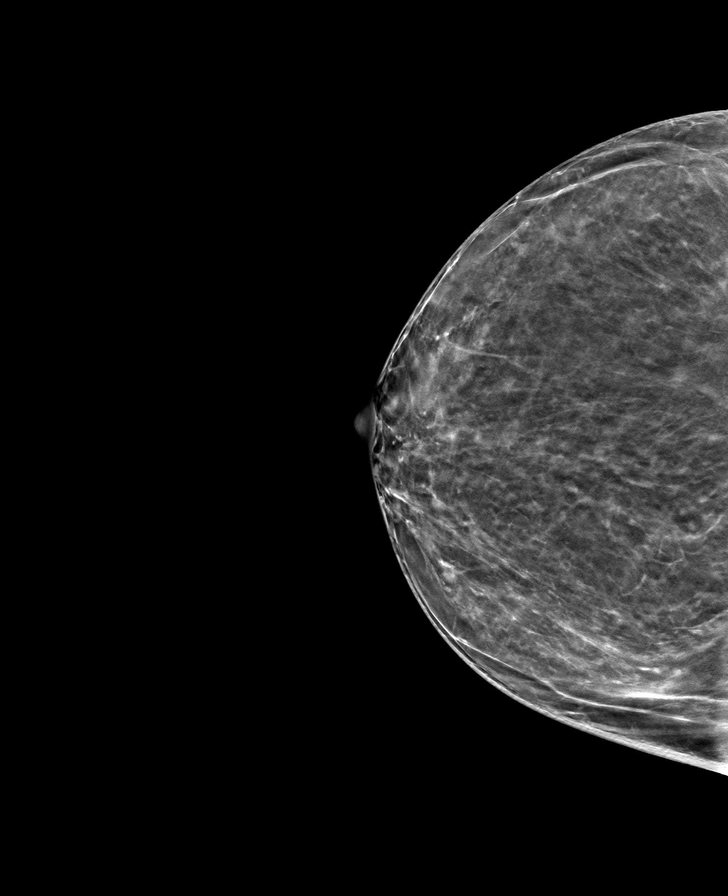

[8 of 24 positions shown; findings below may reference images not displayed]

ACR Breast Density Category b: There are scattered areas of
fibroglandular density.
FINDINGS: There are no findings suspicious for malignancy. Images were
processed with CAD.
IMPRESSION: No mammographic evidence of malignancy. A result letter of this
screening mammogram will be mailed directly to the patient.

RECOMMENDATION:
Screening mammogram in one year. (Code:[id number])

BI-RADS CATEGORY  1: Negative.

## 2021-05-21 ENCOUNTER — Other Ambulatory Visit: Payer: Self-pay | Admitting: Internal Medicine

## 2021-05-27 ENCOUNTER — Other Ambulatory Visit: Payer: Self-pay | Admitting: Internal Medicine

## 2021-06-10 ENCOUNTER — Encounter: Payer: Self-pay | Admitting: Internal Medicine

## 2021-06-10 LAB — HM COLONOSCOPY

## 2021-06-16 LAB — HM COLONOSCOPY

## 2021-06-19 ENCOUNTER — Encounter: Payer: Self-pay | Admitting: Internal Medicine

## 2021-07-21 ENCOUNTER — Encounter: Payer: Self-pay | Admitting: Internal Medicine

## 2021-07-29 ENCOUNTER — Encounter: Payer: Self-pay | Admitting: Internal Medicine

## 2021-07-29 ENCOUNTER — Other Ambulatory Visit: Payer: Self-pay

## 2021-07-29 ENCOUNTER — Ambulatory Visit (INDEPENDENT_AMBULATORY_CARE_PROVIDER_SITE_OTHER): Payer: BC Managed Care – PPO | Admitting: Internal Medicine

## 2021-07-29 VITALS — BP 136/70 | HR 80 | Temp 99.0°F | Ht 66.4 in | Wt 197.0 lb

## 2021-07-29 DIAGNOSIS — Z0000 Encounter for general adult medical examination without abnormal findings: Secondary | ICD-10-CM | POA: Diagnosis not present

## 2021-07-29 DIAGNOSIS — I10 Essential (primary) hypertension: Secondary | ICD-10-CM | POA: Diagnosis not present

## 2021-07-29 DIAGNOSIS — H9201 Otalgia, right ear: Secondary | ICD-10-CM

## 2021-07-29 DIAGNOSIS — Z6831 Body mass index (BMI) 31.0-31.9, adult: Secondary | ICD-10-CM

## 2021-07-29 DIAGNOSIS — E1169 Type 2 diabetes mellitus with other specified complication: Secondary | ICD-10-CM | POA: Diagnosis not present

## 2021-07-29 DIAGNOSIS — E6609 Other obesity due to excess calories: Secondary | ICD-10-CM

## 2021-07-29 DIAGNOSIS — E785 Hyperlipidemia, unspecified: Secondary | ICD-10-CM | POA: Diagnosis not present

## 2021-07-29 DIAGNOSIS — Z23 Encounter for immunization: Secondary | ICD-10-CM | POA: Diagnosis not present

## 2021-07-29 LAB — POCT UA - MICROALBUMIN
Albumin/Creatinine Ratio, Urine, POC: 30
Creatinine, POC: 300 mg/dL
Microalbumin Ur, POC: 10 mg/L

## 2021-07-29 LAB — POCT URINALYSIS DIPSTICK
Bilirubin, UA: NEGATIVE
Blood, UA: NEGATIVE
Glucose, UA: NEGATIVE
Leukocytes, UA: NEGATIVE
Nitrite, UA: NEGATIVE
Protein, UA: NEGATIVE
Spec Grav, UA: 1.025 (ref 1.010–1.025)
Urobilinogen, UA: 0.2 E.U./dL
pH, UA: 5.5 (ref 5.0–8.0)

## 2021-07-29 MED ORDER — CYCLOBENZAPRINE HCL 5 MG PO TABS
ORAL_TABLET | ORAL | 0 refills | Status: DC
Start: 2021-07-29 — End: 2022-02-09

## 2021-07-29 NOTE — Patient Instructions (Addendum)
Health Maintenance, Female Adopting a healthy lifestyle and getting preventive care are important in promoting health and wellness. Ask your health care provider about: The right schedule for you to have regular tests and exams. Things you can do on your own to prevent diseases and keep yourself healthy. What should I know about diet, weight, and exercise? Eat a healthy diet  Eat a diet that includes plenty of vegetables, fruits, low-fat dairy products, and lean protein. Do not eat a lot of foods that are high in solid fats, added sugars, or sodium.  Maintain a healthy weight Body mass index (BMI) is used to identify weight problems. It estimates body fat based on height and weight. Your health care provider can help determineyour BMI and help you achieve or maintain a healthy weight. Get regular exercise Get regular exercise. This is one of the most important things you can do for your health. Most adults should: Exercise for at least 150 minutes each week. The exercise should increase your heart rate and make you sweat (moderate-intensity exercise). Do strengthening exercises at least twice a week. This is in addition to the moderate-intensity exercise. Spend less time sitting. Even light physical activity can be beneficial. Watch cholesterol and blood lipids Have your blood tested for lipids and cholesterol at 61 years of age, then havethis test every 5 years. Have your cholesterol levels checked more often if: Your lipid or cholesterol levels are high. You are older than 61 years of age. You are at high risk for heart disease. What should I know about cancer screening? Depending on your health history and family history, you may need to have cancer screening at various ages. This may include screening for: Breast cancer. Cervical cancer. Colorectal cancer. Skin cancer. Lung cancer. What should I know about heart disease, diabetes, and high blood pressure? Blood pressure and heart  disease High blood pressure causes heart disease and increases the risk of stroke. This is more likely to develop in people who have high blood pressure readings, are of African descent, or are overweight. Have your blood pressure checked: Every 3-5 years if you are 18-39 years of age. Every year if you are 40 years old or older. Diabetes Have regular diabetes screenings. This checks your fasting blood sugar level. Have the screening done: Once every three years after age 40 if you are at a normal weight and have a low risk for diabetes. More often and at a younger age if you are overweight or have a high risk for diabetes. What should I know about preventing infection? Hepatitis B If you have a higher risk for hepatitis B, you should be screened for this virus. Talk with your health care provider to find out if you are at risk forhepatitis B infection. Hepatitis C Testing is recommended for: Everyone born from 1945 through 1965. Anyone with known risk factors for hepatitis C. Sexually transmitted infections (STIs) Get screened for STIs, including gonorrhea and chlamydia, if: You are sexually active and are younger than 61 years of age. You are older than 61 years of age and your health care provider tells you that you are at risk for this type of infection. Your sexual activity has changed since you were last screened, and you are at increased risk for chlamydia or gonorrhea. Ask your health care provider if you are at risk. Ask your health care provider about whether you are at high risk for HIV. Your health care provider may recommend a prescription medicine to help   prevent HIV infection. If you choose to take medicine to prevent HIV, you should first get tested for HIV. You should then be tested every 3 months for as long as you are taking the medicine. Pregnancy If you are about to stop having your period (premenopausal) and you may become pregnant, seek counseling before you get  pregnant. Take 400 to 800 micrograms (mcg) of folic acid every day if you become pregnant. Ask for birth control (contraception) if you want to prevent pregnancy. Osteoporosis and menopause Osteoporosis is a disease in which the bones lose minerals and strength with aging. This can result in bone fractures. If you are 74 years old or older, or if you are at risk for osteoporosis and fractures, ask your health care provider if you should: Be screened for bone loss. Take a calcium or vitamin D supplement to lower your risk of fractures. Be given hormone replacement therapy (HRT) to treat symptoms of menopause. Follow these instructions at home: Lifestyle Do not use any products that contain nicotine or tobacco, such as cigarettes, e-cigarettes, and chewing tobacco. If you need help quitting, ask your health care provider. Do not use street drugs. Do not share needles. Ask your health care provider for help if you need support or information about quitting drugs. Alcohol use Do not drink alcohol if: Your health care provider tells you not to drink. You are pregnant, may be pregnant, or are planning to become pregnant. If you drink alcohol: Limit how much you use to 0-1 drink a day. Limit intake if you are breastfeeding. Be aware of how much alcohol is in your drink. In the [place] one drink equals one 12 oz bottle of beer (355 mL), one 5 oz glass of wine (148 mL), or one 1 oz glass of hard liquor (44 mL). General instructions Schedule regular health, dental, and eye exams. Stay current with your vaccines. Tell your health care provider if: You often feel depressed. You have ever been abused or do not feel safe at home. Summary Adopting a healthy lifestyle and getting preventive care are important in promoting health and wellness. Follow your health care provider's instructions about healthy diet, exercising, and getting tested or screened for diseases. Follow your health care provider's  instructions on monitoring your cholesterol and blood pressure. This information is not intended to replace advice given to you by your health care provider. Make sure you discuss any questions you have with your healthcare provider. Document Revised: 11/30/2018 Document Reviewed: 11/30/2018  Temporomandibular Joint Syndrome  Temporomandibular joint syndrome (TMJ syndrome) is a condition that causes pain in the temporomandibular joints. These joints are located near your ears and allow your jaw to open and close. For people with TMJ syndrome, chewing,biting, or other movements of the jaw can be difficult or painful. TMJ syndrome is often mild and goes away within a few weeks. However, sometimes the condition becomes a long-term (chronic) problem. What are the causes? This condition may be caused by: Grinding your teeth or clenching your jaw. Some people do this when they are under stress. Arthritis. Injury to the jaw. Head or neck injury. Teeth or dentures that are not aligned well. In some cases, the cause of TMJ syndrome may not be known. What are the signs or symptoms? The most common symptom of this condition is an aching pain on the side of the head in the area of the TMJ. Other symptoms may include: Pain when moving your jaw, such as when chewing or biting.  Being unable to open your jaw all the way. Making a clicking sound when you open your mouth. Headache. Earache. Neck or shoulder pain. How is this diagnosed? This condition may be diagnosed based on: Your symptoms and medical history. A physical exam. Your health care provider may check the range of motion of your jaw. Imaging tests, such as X-rays or an MRI. You may also need to see your dentist, who will determine if your teeth and jaware lined up correctly. How is this treated? TMJ syndrome often goes away on its own. If treatment is needed, the options may include: Eating soft foods and applying ice or heat. Medicines to  relieve pain or inflammation. Medicines or massage to relax the muscles. A splint, bite plate, or mouthpiece to prevent teeth grinding or jaw clenching. Relaxation techniques or counseling to help reduce stress. A therapy for pain in which an electrical current is applied to the nerves through the skin (transcutaneous electrical nerve stimulation). Acupuncture. This is sometimes helpful to relieve pain. Jaw surgery. This is rarely needed. Follow these instructions at home:  Eating and drinking Eat a soft diet if you are having trouble chewing. Avoid foods that require a lot of chewing. Do not chew gum. General instructions Take over-the-counter and prescription medicines only as told by your health care provider. If directed, put ice on the painful area. Put ice in a plastic bag. Place a towel between your skin and the bag. Leave the ice on for 20 minutes, 2-3 times a day. Apply a warm, wet cloth (warm compress) to the painful area as directed. Massage your jaw area and do any jaw stretching exercises as told by your health care provider. If you were given a splint, bite plate, or mouthpiece, wear it as told by your health care provider. Keep all follow-up visits as told by your health care provider. This is important. Contact a health care provider if: You are having trouble eating. You have new or worsening symptoms. Get help right away if: Your jaw locks open or closed. Summary Temporomandibular joint syndrome (TMJ syndrome) is a condition that causes pain in the temporomandibular joints. These joints are located near your ears and allow your jaw to open and close. TMJ syndrome is often mild and goes away within a few weeks. However, sometimes the condition becomes a long-term (chronic) problem. Symptoms include an aching pain on the side of the head in the area of the TMJ, pain when chewing or biting, and being unable to open your jaw all the way. You may also make a clicking sound  when you open your mouth. TMJ syndrome often goes away on its own. If treatment is needed, it may include medicines to relieve pain, reduce inflammation, or relax the muscles. A splint, bite plate, or mouthpiece may also be used to prevent teeth grinding or jaw clenching. This information is not intended to replace advice given to you by your health care provider. Make sure you discuss any questions you have with your healthcare provider. Document Revised: 02/18/2018 Document Reviewed: 01/18/2018 Elsevier Patient Education  2022 [employer].  Elsevier Patient Education  2022 [employer].

## 2021-07-29 NOTE — Progress Notes (Signed)
I,Tianna Badgett,acting as a Education administrator for [name], MD.,have documented all relevant documentation on the behalf of [name], MD,as directed by  [name], MD while in the presence of [name], MD.  This visit occurred during the SARS-CoV-2 public health emergency.  Safety protocols were in place, including screening questions prior to the visit, additional usage of staff PPE, and extensive cleaning of exam room while observing appropriate contact time as indicated for disinfecting solutions.  Subjective:     Patient ID: [name] , female    DOB: 29-Feb-1961 , 60 y.o.   MRN: [id number]   Chief Complaint  Patient presents with   Annual Exam   Diabetes   Hypertension    HPI  She is here today for a full physical examination. She is followed by Dr. Cleda Clarks for her GYN exams. Her last visit was December 2021.  Diabetes She presents for her follow-up diabetic visit. She has type 2 diabetes mellitus. There are no hypoglycemic associated symptoms. Pertinent negatives for diabetes include no blurred vision and no chest pain. There are no hypoglycemic complications. Risk factors for coronary artery disease include diabetes mellitus, dyslipidemia, hypertension, post-menopausal and sedentary lifestyle. She is following a diabetic diet. She participates in exercise intermittently. An ACE inhibitor/angiotensin II receptor blocker is being taken. She does not see a podiatrist.Eye exam is current.  Hypertension This is a chronic problem. The current episode started more than 1 year ago. The problem has been gradually improving since onset. The problem is controlled. Pertinent negatives include no blurred vision, chest pain, palpitations or shortness of breath. Risk factors for coronary artery disease include diabetes mellitus, dyslipidemia, smoking/tobacco exposure and post-menopausal state. Compliance problems include exercise.     Past Medical History:  Diagnosis Date    Diabetes mellitus    Hyperlipemia    Hypertension    Hypoxemia    Obesity    Vitamin D deficiency      Family History  Problem Relation Age of Onset   Heart disease Mother    Uterine cancer Mother    Cancer Father    Breast cancer Maternal Aunt    Breast cancer Cousin    Cancer Maternal Grandmother      Current Outpatient Medications:    amLODipine (NORVASC) 5 MG tablet, TAKE 1 TABLET(5 MG) BY MOUTH DAILY, Disp: 90 tablet, Rfl: 1   aspirin EC 81 MG tablet, Take 81 mg by mouth daily., Disp: , Rfl:    atorvastatin (LIPITOR) 20 MG tablet, TAKE 1 TABLET BY MOUTH EVERY DAY, Disp: 90 tablet, Rfl: 1   Cholecalciferol (VITAMIN D3) 5000 units CAPS, Take by mouth., Disp: , Rfl:    cyclobenzaprine (FLEXERIL) 5 MG tablet, One tab po qpm prn, Disp: 30 tablet, Rfl: 0   fluticasone (FLONASE) 50 MCG/ACT nasal spray, SHAKE LIQUID AND USE 1 SPRAY IN EACH NOSTRIL DAILY, Disp: 16 g, Rfl: 2   losartan (COZAAR) 100 MG tablet, Take 1 tablet (100 mg total) by mouth daily., Disp: 90 tablet, Rfl: 1   Magnesium Oxide POWD, Take by mouth., Disp: , Rfl:    No Known Allergies    The patient states she uses post menopausal status for birth control. Last LMP was No LMP recorded. Patient has had a hysterectomy.. Negative for Dysmenorrhea. Negative for: breast discharge, breast lump(s), breast pain and breast self exam. Associated symptoms include abnormal vaginal bleeding. Pertinent negatives include abnormal bleeding (hematology), anxiety, decreased libido, depression, difficulty falling sleep, dyspareunia, history of  infertility, nocturia, sexual dysfunction, sleep disturbances, urinary incontinence, urinary urgency, vaginal discharge and vaginal itching. Diet regular.The patient states her exercise level is intermittent.  . The patient's tobacco use is:  Social History   Tobacco Use  Smoking Status Former   Packs/day: 1.00   Years: 30.00   Pack years: 30.00   Types: Cigarettes   Start date: 41   Quit  date: 2016   Years since quitting: 6.6  Smokeless Tobacco Never  . She has been exposed to passive smoke. The patient's alcohol use is:  Social History   Substance and Sexual Activity  Alcohol Use Yes   Alcohol/week: 0.0 standard drinks    Review of Systems  Constitutional: Negative.   HENT:  Positive for ear pain and sore throat.        She c/o worsening ear pain. Unable to determine what triggers her sx. Eating/chewing does not affect her sx. Denies hearing loss. Starts at back of ear and radiates towards her jaw.  Eyes: Negative.  Negative for blurred vision.  Respiratory: Negative.  Negative for shortness of breath.   Cardiovascular: Negative.  Negative for chest pain and palpitations.  Gastrointestinal: Negative.   Endocrine: Negative.   Genitourinary: Negative.   Musculoskeletal: Negative.   Skin: Negative.   Allergic/Immunologic: Negative.   Neurological: Negative.   Hematological: Negative.   Psychiatric/Behavioral: Negative.      Today's Vitals   07/29/21 1038  BP: 136/70  Pulse: 80  Temp: 99 F (37.2 C)  TempSrc: Oral  Weight: 197 lb (89.4 kg)  Height: 5' 6.4" (1.687 m)   Body mass index is 31.41 kg/m.  Wt Readings from Last 3 Encounters:  07/29/21 197 lb (89.4 kg)  03/31/21 198 lb (89.8 kg)  11/28/20 194 lb (88 kg)    Objective:  Physical Exam Vitals and nursing note reviewed.  Constitutional:      Appearance: Normal appearance.  HENT:     Head: Normocephalic and atraumatic.     Right Ear: Tympanic membrane, ear canal and external ear normal.     Left Ear: Tympanic membrane, ear canal and external ear normal.     Nose:     Comments: Masked     Mouth/Throat:     Comments: Masked  Eyes:     Extraocular Movements: Extraocular movements intact.     Conjunctiva/sclera: Conjunctivae normal.     Pupils: Pupils are equal, round, and reactive to light.  Cardiovascular:     Rate and Rhythm: Normal rate and regular rhythm.     Pulses: Normal pulses.           Dorsalis pedis pulses are 2+ on the right side and 2+ on the left side.     Heart sounds: Normal heart sounds.  Pulmonary:     Effort: Pulmonary effort is normal.     Breath sounds: Normal breath sounds.  Abdominal:     General: Abdomen is flat. Bowel sounds are normal.     Palpations: Abdomen is soft.  Genitourinary:    Comments: deferred Musculoskeletal:        General: Normal range of motion.     Cervical back: Normal range of motion and neck supple.  Feet:     Right foot:     Protective Sensation: 5 sites tested.  5 sites sensed.     Skin integrity: Dry skin present.     Toenail Condition: Right toenails are normal.     Left foot:     Protective Sensation:  5 sites tested.  5 sites sensed.     Skin integrity: Dry skin present.     Toenail Condition: Left toenails are normal.  Skin:    General: Skin is warm and dry.  Neurological:     General: No focal deficit present.     Mental Status: She is alert and oriented to person, place, and time.  Psychiatric:        Mood and Affect: Mood normal.        Behavior: Behavior normal.        Assessment And Plan:     1. Encounter for annual physical exam Comments: a full exam was performed. Importance of monthly self breast exams was discussed with the patient.  PATIENT IS ADVISED TO GET 30-45 MINUTES REGULAR EXERCISE NO LESS THAN FOUR TO FIVE DAYS PER WEEK - BOTH WEIGHTBEARING EXERCISES AND AEROBIC ARE RECOMMENDED.  PATIENT IS ADVISED TO FOLLOW A HEALTHY DIET WITH AT LEAST SIX FRUITS/VEGGIES PER DAY, DECREASE INTAKE OF RED MEAT, AND TO INCREASE FISH INTAKE TO TWO DAYS PER WEEK.  MEATS/FISH SHOULD NOT BE FRIED, BAKED OR BROILED IS PREFERABLE.  IT IS ALSO IMPORTANT TO CUT BACK ON YOUR SUGAR INTAKE. PLEASE AVOID ANYTHING WITH ADDED SUGAR, CORN SYRUP OR OTHER SWEETENERS. IF YOU MUST USE A SWEETENER, YOU CAN TRY STEVIA. IT IS ALSO IMPORTANT TO AVOID ARTIFICIALLY SWEETENERS AND DIET BEVERAGES. LASTLY, I SUGGEST WEARING SPF 50 SUNSCREEN ON  EXPOSED PARTS AND ESPECIALLY WHEN IN THE DIRECT SUNLIGHT FOR AN EXTENDED PERIOD OF TIME.  PLEASE AVOID FAST FOOD RESTAURANTS AND INCREASE YOUR WATER INTAKE.   - CBC - Hemoglobin A1c - CMP14+EGFR - Lipid panel - TSH  2. Type 2 diabetes mellitus with hyperlipidemia (HCC) Comments: Diabetic foot exam was performed. Importance of medication, dietary and exercise compliance was d/w pt. I DISCUSSED WITH THE PATIENT AT LENGTH REGARDING THE GOALS OF GLYCEMIC CONTROL AND POSSIBLE LONG-TERM COMPLICATIONS.  I  ALSO STRESSED THE IMPORTANCE OF COMPLIANCE WITH HOME GLUCOSE MONITORING, DIETARY RESTRICTIONS INCLUDING AVOIDANCE OF SUGARY DRINKS/PROCESSED FOODS,  ALONG WITH REGULAR EXERCISE.  I  ALSO STRESSED THE IMPORTANCE OF ANNUAL EYE EXAMS, SELF FOOT CARE AND COMPLIANCE WITH OFFICE VISITS.  3. Essential hypertension, benign Comments: Chronic, fair control. EKG performed, NSR w/ LAFB. NO new changes. Advised to follow low sodium diet.  - POCT Urinalysis Dipstick (81002) - POCT UA - Microalbumin - EKG 12-Lead  4. Referred otalgia of right ear Comments: There is no associated lymphadenopathy. Sx suggestive of TMJ. I will send rx cyclobenzaprine nightly to use prn. I will also refer her to ENT for further evaluation.   5. Class 1 obesity due to excess calories with serious comorbidity and body mass index (BMI) of 31.0 to 31.9 in adult Comments: She is encouraged to aim for at least 150 minutes of exercise per week.   6. Immunization due Comments: She was given Shingrix IM x 1.   Patient was given opportunity to ask questions. Patient verbalized understanding of the plan and was able to repeat key elements of the plan. All questions were answered to their satisfaction.   I, [name], MD, have reviewed all documentation for this visit. The documentation on 07/29/21 for the exam, diagnosis, procedures, and orders are all accurate and complete.  THE PATIENT IS ENCOURAGED TO PRACTICE SOCIAL DISTANCING  DUE TO THE COVID-19 PANDEMIC.

## 2021-07-30 LAB — TSH: TSH: 1.13 u[IU]/mL (ref 0.450–4.500)

## 2021-07-30 LAB — CMP14+EGFR
ALT: 32 IU/L (ref 0–32)
AST: 30 IU/L (ref 0–40)
Albumin/Globulin Ratio: 2.1 (ref 1.2–2.2)
Albumin: 5 g/dL — ABNORMAL HIGH (ref 3.8–4.9)
Alkaline Phosphatase: 134 IU/L — ABNORMAL HIGH (ref 44–121)
BUN/Creatinine Ratio: 14 (ref 12–28)
BUN: 14 mg/dL (ref 8–27)
Bilirubin Total: 0.3 mg/dL (ref 0.0–1.2)
CO2: 25 mmol/L (ref 20–29)
Calcium: 9.8 mg/dL (ref 8.7–10.3)
Chloride: 103 mmol/L (ref 96–106)
Creatinine, Ser: 0.99 mg/dL (ref 0.57–1.00)
Globulin, Total: 2.4 g/dL (ref 1.5–4.5)
Glucose: 87 mg/dL (ref 65–99)
Potassium: 4.3 mmol/L (ref 3.5–5.2)
Sodium: 141 mmol/L (ref 134–144)
Total Protein: 7.4 g/dL (ref 6.0–8.5)
eGFR: 65 mL/min/{1.73_m2} (ref >59)

## 2021-07-30 LAB — LIPID PANEL
Chol/HDL Ratio: 2.8 ratio (ref 0.0–4.4)
Cholesterol, Total: 159 mg/dL (ref 100–199)
HDL: 57 mg/dL (ref >39)
LDL Chol Calc (NIH): 79 mg/dL (ref 0–99)
Triglycerides: 133 mg/dL (ref 0–149)
VLDL Cholesterol Cal: 23 mg/dL (ref 5–40)

## 2021-07-30 LAB — CBC
Hematocrit: 41.6 % (ref 34.0–46.6)
Hemoglobin: 13.9 g/dL (ref 11.1–15.9)
MCH: 26.8 pg (ref 26.6–33.0)
MCHC: 33.4 g/dL (ref 31.5–35.7)
MCV: 80 fL (ref 79–97)
Platelets: 272 10*3/uL (ref 150–450)
RBC: 5.18 x10E6/uL (ref 3.77–5.28)
RDW: 13.1 % (ref 11.7–15.4)
WBC: 7.3 10*3/uL (ref 3.4–10.8)

## 2021-07-30 LAB — HEMOGLOBIN A1C
Est. average glucose Bld gHb Est-mCnc: 123 mg/dL
Hgb A1c MFr Bld: 5.9 % — ABNORMAL HIGH (ref 4.8–5.6)

## 2021-07-31 LAB — HM MAMMOGRAPHY

## 2021-08-04 ENCOUNTER — Encounter: Payer: Self-pay | Admitting: Internal Medicine

## 2021-08-05 ENCOUNTER — Encounter: Payer: BC Managed Care – PPO | Admitting: Internal Medicine

## 2021-08-20 ENCOUNTER — Other Ambulatory Visit: Payer: Self-pay | Admitting: Internal Medicine

## 2021-09-24 HISTORY — PX: MENISCUS REPAIR: SHX5179

## 2021-09-30 ENCOUNTER — Encounter: Payer: Self-pay | Admitting: Internal Medicine

## 2021-09-30 NOTE — Telephone Encounter (Signed)
Called [facility] 09/30/21 TSH lab was denied due to the dx code has updated the code to R94.6 it will be re billed please allow 30-45 days

## 2021-11-20 ENCOUNTER — Other Ambulatory Visit: Payer: Self-pay | Admitting: Internal Medicine

## 2021-12-01 ENCOUNTER — Encounter: Payer: Self-pay | Admitting: Internal Medicine

## 2021-12-01 ENCOUNTER — Other Ambulatory Visit: Payer: Self-pay

## 2021-12-01 ENCOUNTER — Ambulatory Visit: Payer: BC Managed Care – PPO | Admitting: Internal Medicine

## 2021-12-01 VITALS — BP 110/62 | HR 71 | Temp 98.8°F | Ht 66.4 in | Wt 188.8 lb

## 2021-12-01 DIAGNOSIS — I10 Essential (primary) hypertension: Secondary | ICD-10-CM | POA: Diagnosis not present

## 2021-12-01 DIAGNOSIS — E1169 Type 2 diabetes mellitus with other specified complication: Secondary | ICD-10-CM | POA: Diagnosis not present

## 2021-12-01 DIAGNOSIS — E785 Hyperlipidemia, unspecified: Secondary | ICD-10-CM | POA: Diagnosis not present

## 2021-12-01 DIAGNOSIS — Z6831 Body mass index (BMI) 31.0-31.9, adult: Secondary | ICD-10-CM

## 2021-12-01 DIAGNOSIS — Z23 Encounter for immunization: Secondary | ICD-10-CM | POA: Diagnosis not present

## 2021-12-01 DIAGNOSIS — E6609 Other obesity due to excess calories: Secondary | ICD-10-CM | POA: Diagnosis not present

## 2021-12-01 NOTE — Progress Notes (Signed)
I,Katawbba Wiggins,acting as a Education administrator for [name], MD.,have documented all relevant documentation on the behalf of [name], MD,as directed by  [name], MD while in the presence of [name], MD.  This visit occurred during the SARS-CoV-2 public health emergency.  Safety protocols were in place, including screening questions prior to the visit, additional usage of staff PPE, and extensive cleaning of exam room while observing appropriate contact time as indicated for disinfecting solutions.  Subjective:     Patient ID: [name] , female    DOB: Apr 20, 1961 , 60 y.o.   MRN: [id number]   Chief Complaint  Patient presents with   Diabetes   Hypertension    HPI  The patient is here today for a diabetes f/u.  She is no longer taking meds for this. She feels well today. Since her last visit, she has had right knee surgery, meniscal repair. She has not had any complications.   Diabetes She presents for her follow-up diabetic visit. She has type 2 diabetes mellitus. Her disease course has been stable. There are no hypoglycemic associated symptoms. Pertinent negatives for hypoglycemia include no headaches. There are no diabetic associated symptoms. Pertinent negatives for diabetes include no blurred vision and no chest pain. There are no hypoglycemic complications. There are no diabetic complications. Risk factors for coronary artery disease include diabetes mellitus, dyslipidemia, hypertension, post-menopausal and sedentary lifestyle. She is compliant with treatment most of the time. She is following a diabetic diet. She participates in exercise three times a week. She does not see a podiatrist.Eye exam is current.  Hypertension This is a chronic problem. The current episode started more than 1 year ago. The problem has been gradually improving since onset. The problem is controlled. Pertinent negatives include no blurred vision, chest pain, headaches, palpitations or  shortness of breath.    Past Medical History:  Diagnosis Date   Diabetes mellitus    Hyperlipemia    Hypertension    Hypoxemia    Obesity    Vitamin D deficiency      Family History  Problem Relation Age of Onset   Heart disease Mother    Uterine cancer Mother    Cancer Father    Breast cancer Maternal Aunt    Breast cancer Cousin    Cancer Maternal Grandmother      Current Outpatient Medications:    amLODipine (NORVASC) 5 MG tablet, TAKE 1 TABLET(5 MG) BY MOUTH DAILY, Disp: 90 tablet, Rfl: 1   aspirin EC 81 MG tablet, Take 81 mg by mouth daily., Disp: , Rfl:    atorvastatin (LIPITOR) 20 MG tablet, TAKE 1 TABLET BY MOUTH EVERY DAY, Disp: 90 tablet, Rfl: 1   Cholecalciferol (VITAMIN D3) 5000 units CAPS, Take by mouth., Disp: , Rfl:    fluticasone (FLONASE) 50 MCG/ACT nasal spray, SHAKE LIQUID AND USE 1 SPRAY IN EACH NOSTRIL DAILY, Disp: 16 g, Rfl: 2   losartan (COZAAR) 100 MG tablet, Take 1 tablet (100 mg total) by mouth daily., Disp: 90 tablet, Rfl: 1   Magnesium Oxide POWD, Take by mouth., Disp: , Rfl:    cyclobenzaprine (FLEXERIL) 5 MG tablet, One tab po qpm prn (Patient not taking: Reported on 12/01/2021), Disp: 30 tablet, Rfl: 0   No Known Allergies   Review of Systems  Constitutional: Negative.   Eyes:  Negative for blurred vision.  Respiratory: Negative.  Negative for shortness of breath.   Cardiovascular: Negative.  Negative for chest pain and palpitations.  Gastrointestinal: Negative.   Neurological:  Negative for headaches.  Psychiatric/Behavioral: Negative.    All other systems reviewed and are negative.   Today's Vitals   12/01/21 1130  BP: 110/62  Pulse: 71  Temp: 98.8 F (37.1 C)  Weight: 188 lb 12.8 oz (85.6 kg)  Height: 5' 6.4" (1.687 m)   Body mass index is 30.11 kg/m.  Wt Readings from Last 3 Encounters:  12/01/21 188 lb 12.8 oz (85.6 kg)  07/29/21 197 lb (89.4 kg)  03/31/21 198 lb (89.8 kg)    BP Readings from Last 3 Encounters:   12/01/21 110/62  07/29/21 136/70  11/28/20 114/76    Objective:  Physical Exam Vitals and nursing note reviewed.  Constitutional:      Appearance: Normal appearance.  HENT:     Head: Normocephalic and atraumatic.     Nose:     Comments: Masked     Mouth/Throat:     Comments: Masked  Eyes:     Extraocular Movements: Extraocular movements intact.  Cardiovascular:     Rate and Rhythm: Normal rate and regular rhythm.     Heart sounds: Normal heart sounds.  Pulmonary:     Effort: Pulmonary effort is normal.     Breath sounds: Normal breath sounds.  Musculoskeletal:     Cervical back: Normal range of motion.  Skin:    General: Skin is warm.  Neurological:     General: No focal deficit present.     Mental Status: She is alert.  Psychiatric:        Mood and Affect: Mood normal.        Behavior: Behavior normal.        Assessment And Plan:     1. Type 2 diabetes mellitus with hyperlipidemia ([facility]) Comments: Chronic, I will check an a1c today. She is no longer required to take meds for her diabetes.  She will rto in 4 months for re-evaluation.  - CMP14+EGFR - Hemoglobin A1c  2. Essential hypertension, benign Comments: Chronic, well controlled. No med changes today. Encouraged to follow low sodium diet.  - CMP14+EGFR  3. Class 1 obesity due to excess calories with serious comorbidity and body mass index (BMI) of 31.0 to 31.9 in adult Comments: She was congratulated on her 9 lb weight loss. She is encouraged to increase daily activity as tolerated.   4. Immunization due Comments: She was given second Shingrix vaccine.  - Varicella-zoster vaccine IM (Shingrix)   Patient was given opportunity to ask questions. Patient verbalized understanding of the plan and was able to repeat key elements of the plan. All questions were answered to their satisfaction.   I, [name], MD, have reviewed all documentation for this visit. The documentation on 12/01/21 for the exam,  diagnosis, procedures, and orders are all accurate and complete.   IF YOU HAVE BEEN REFERRED TO A SPECIALIST, IT MAY TAKE 1-2 WEEKS TO SCHEDULE/PROCESS THE REFERRAL. IF YOU HAVE NOT HEARD FROM US/SPECIALIST IN TWO WEEKS, PLEASE GIVE [place] A CALL AT [phone].   THE PATIENT IS ENCOURAGED TO PRACTICE SOCIAL DISTANCING DUE TO THE COVID-19 PANDEMIC.

## 2021-12-01 NOTE — Patient Instructions (Signed)

## 2021-12-02 LAB — CMP14+EGFR
ALT: 27 IU/L (ref 0–32)
AST: 26 IU/L (ref 0–40)
Albumin/Globulin Ratio: 1.8 (ref 1.2–2.2)
Albumin: 4.8 g/dL (ref 3.8–4.8)
Alkaline Phosphatase: 150 IU/L — ABNORMAL HIGH (ref 44–121)
BUN/Creatinine Ratio: 17 (ref 12–28)
BUN: 15 mg/dL (ref 8–27)
Bilirubin Total: 0.4 mg/dL (ref 0.0–1.2)
CO2: 24 mmol/L (ref 20–29)
Calcium: 10.3 mg/dL (ref 8.7–10.3)
Chloride: 101 mmol/L (ref 96–106)
Creatinine, Ser: 0.89 mg/dL (ref 0.57–1.00)
Globulin, Total: 2.6 g/dL (ref 1.5–4.5)
Glucose: 97 mg/dL (ref 70–99)
Potassium: 4.3 mmol/L (ref 3.5–5.2)
Sodium: 138 mmol/L (ref 134–144)
Total Protein: 7.4 g/dL (ref 6.0–8.5)
eGFR: 74 mL/min/{1.73_m2} (ref >59)

## 2021-12-02 LAB — HEMOGLOBIN A1C
Est. average glucose Bld gHb Est-mCnc: 120 mg/dL
Hgb A1c MFr Bld: 5.8 % — ABNORMAL HIGH (ref 4.8–5.6)

## 2021-12-06 LAB — ALKALINE PHOSPHATASE, ISOENZYMES
Alkaline Phosphatase: 147 IU/L — ABNORMAL HIGH (ref 44–121)
BONE FRACTION: 25 % (ref 14–68)
INTESTINAL FRAC.: 3 % (ref 0–18)
LIVER FRACTION: 72 % (ref 18–85)

## 2021-12-06 LAB — SPECIMEN STATUS REPORT

## 2021-12-18 ENCOUNTER — Other Ambulatory Visit: Payer: Self-pay | Admitting: Internal Medicine

## 2022-01-05 LAB — HM DIABETES EYE EXAM

## 2022-02-08 ENCOUNTER — Other Ambulatory Visit: Payer: Self-pay | Admitting: Internal Medicine

## 2022-03-10 ENCOUNTER — Other Ambulatory Visit: Payer: Self-pay | Admitting: Internal Medicine

## 2022-04-09 ENCOUNTER — Encounter: Payer: Self-pay | Admitting: Internal Medicine

## 2022-04-09 ENCOUNTER — Ambulatory Visit: Payer: BC Managed Care – PPO | Admitting: Internal Medicine

## 2022-04-09 VITALS — BP 124/76 | HR 84 | Temp 97.8°F | Ht 65.6 in | Wt 193.2 lb

## 2022-04-09 DIAGNOSIS — E1169 Type 2 diabetes mellitus with other specified complication: Secondary | ICD-10-CM

## 2022-04-09 DIAGNOSIS — I10 Essential (primary) hypertension: Secondary | ICD-10-CM

## 2022-04-09 DIAGNOSIS — G8929 Other chronic pain: Secondary | ICD-10-CM | POA: Diagnosis not present

## 2022-04-09 DIAGNOSIS — Z6831 Body mass index (BMI) 31.0-31.9, adult: Secondary | ICD-10-CM

## 2022-04-09 DIAGNOSIS — E785 Hyperlipidemia, unspecified: Secondary | ICD-10-CM

## 2022-04-09 DIAGNOSIS — R748 Abnormal levels of other serum enzymes: Secondary | ICD-10-CM

## 2022-04-09 DIAGNOSIS — M25512 Pain in left shoulder: Secondary | ICD-10-CM

## 2022-04-09 DIAGNOSIS — E6609 Other obesity due to excess calories: Secondary | ICD-10-CM

## 2022-04-09 NOTE — Patient Instructions (Addendum)

## 2022-04-09 NOTE — Progress Notes (Signed)
?[name],acting as a Education administrator for Lori Greenland, MD.,have documented all relevant documentation on the behalf of Lori Greenland, MD,as directed by  Lori Greenland, MD while in the presence of Lori Greenland, MD.  ?This visit occurred during the SARS-CoV-2 public health emergency.  Safety protocols were in place, including screening questions prior to the visit, additional usage of staff PPE, and extensive cleaning of exam room while observing appropriate contact time as indicated for disinfecting solutions. ? ?Subjective:  ?  ? Patient ID: [name] , female    DOB: December 26, 1960 , 61 y.o.   MRN: [id number] ? ? ?Chief Complaint  ?Patient presents with  ? Diabetes  ? Hypertension  ? ? ?HPI ? ?Patient presents today for a diabetes and bp check. Patient reports compliance with her bp meds. She has been able to control her diabetes without medication in recent years. Patient does not have any questions or concerns at this time. She denies headaches, chest pain and shortness of breath.  ? ?Diabetes ?She presents for her follow-up diabetic visit. She has type 2 diabetes mellitus. Her disease course has been stable. There are no hypoglycemic associated symptoms. Pertinent negatives for hypoglycemia include no headaches. There are no diabetic associated symptoms. Pertinent negatives for diabetes include no blurred vision and no chest pain. There are no hypoglycemic complications. There are no diabetic complications. Risk factors for coronary artery disease include diabetes mellitus, dyslipidemia, hypertension, post-menopausal and sedentary lifestyle. She is compliant with treatment most of the time. She is following a diabetic diet. She participates in exercise three times a week. She does not see a podiatrist.Eye exam is current.  ?Hypertension ?This is a chronic problem. The current episode started more than 1 year ago. The problem has been gradually improving since onset. The problem is controlled.  Pertinent negatives include no blurred vision, chest pain, headaches, palpitations or shortness of breath.   ? ?Past Medical History:  ?Diagnosis Date  ? Diabetes mellitus   ? Hyperlipemia   ? Hypertension   ? Hypoxemia   ? Obesity   ? Vitamin D deficiency   ?  ? ?Family History  ?Problem Relation Age of Onset  ? Heart disease Mother   ? Uterine cancer Mother   ? Cancer Father   ? Breast cancer Maternal Aunt   ? Breast cancer Cousin   ? Cancer Maternal Grandmother   ? ? ? ?Current Outpatient Medications:  ?  amLODipine (NORVASC) 5 MG tablet, TAKE 1 TABLET(5 MG) BY MOUTH DAILY, Disp: 90 tablet, Rfl: 1 ?  aspirin EC 81 MG tablet, Take 81 mg by mouth daily., Disp: , Rfl:  ?  atorvastatin (LIPITOR) 20 MG tablet, TAKE 1 TABLET BY MOUTH EVERY DAY, Disp: 90 tablet, Rfl: 1 ?  Cholecalciferol (VITAMIN D3) 5000 units CAPS, Take by mouth., Disp: , Rfl:  ?  fluticasone (FLONASE) 50 MCG/ACT nasal spray, SHAKE LIQUID AND USE 1 SPRAY IN EACH NOSTRIL DAILY, Disp: 16 g, Rfl: 2 ?  losartan (COZAAR) 100 MG tablet, TAKE 1 TABLET(100 MG) BY MOUTH DAILY, Disp: 90 tablet, Rfl: 1 ?  Magnesium Oxide POWD, Take by mouth., Disp: , Rfl:   ? ?No Known Allergies  ? ?Review of Systems  ?Constitutional: Negative.   ?Eyes:  Negative for blurred vision.  ?Respiratory: Negative.  Negative for shortness of breath.   ?Cardiovascular: Negative.  Negative for chest pain and palpitations.  ?Gastrointestinal: Negative.   ?Musculoskeletal:  Positive for arthralgias.  ?  She c/o left shoulder pain. There is pain with movement. Denies fall/trauma. Not sure what may have triggered her sx.  Sx started last October or November and sx have gradually worsened in frequency and intensity.   ?Neurological: Negative.  Negative for headaches.  ?Psychiatric/Behavioral: Negative.     ? ?Today's Vitals  ? 04/09/22 1011  ?BP: 124/76  ?Pulse: 84  ?Temp: 97.8 ?F (36.6 ?C)  ?Weight: 193 lb 3.2 oz (87.6 kg)  ?Height: 5' 5.6" (1.666 m)  ?PainSc: 3   ?PainLoc: Back  ? ?Body  mass index is 31.56 kg/m?.  ?Wt Readings from Last 3 Encounters:  ?04/09/22 193 lb 3.2 oz (87.6 kg)  ?12/01/21 188 lb 12.8 oz (85.6 kg)  ?07/29/21 197 lb (89.4 kg)  ?  ? ?Objective:  ?Physical Exam ?Vitals and nursing note reviewed.  ?Constitutional:   ?   Appearance: Normal appearance.  ?HENT:  ?   Head: Normocephalic and atraumatic.  ?Eyes:  ?   Extraocular Movements: Extraocular movements intact.  ?Cardiovascular:  ?   Rate and Rhythm: Normal rate and regular rhythm.  ?   Heart sounds: Normal heart sounds.  ?Pulmonary:  ?   Effort: Pulmonary effort is normal.  ?   Breath sounds: Normal breath sounds.  ?Musculoskeletal:     ?   General: Tenderness present.  ?   Cervical back: Normal range of motion.  ?Skin: ?   General: Skin is warm.  ?Neurological:  ?   General: No focal deficit present.  ?   Mental Status: She is alert.  ?Psychiatric:     ?   Mood and Affect: Mood normal.     ?   Behavior: Behavior normal.  ?   ?Assessment And Plan:  ?   ?1. Type 2 diabetes mellitus with hyperlipidemia ([place]) ?Comments: Chronic, I will check labs as below. She is off DM meds, c/w atorvastatin daily. She is encouraged to incorporate more exercise into her daily routine. ?- BMP8+EGFR ?- Hemoglobin A1c ? ?2. Essential hypertension, benign ?Comments: Chronic, well controlled. No med changes. She will c/w amlodipine and losartan daily.  ? ?3. Chronic left shoulder pain ?Comments: Sx suggestive of adhesive capsulitis, but rotator cuff syndrome is a possibility as well. I will refer her to Dr . Assunta [facility] as requested.  ?- Ambulatory referral to Orthopedic Surgery ? ?4. Elevated alkaline phosphatase level ?Comments: I will check vitamin D level today.  ?- Vitamin D (25 hydroxy) ? ?5. Class 1 obesity due to excess calories with serious comorbidity and body mass index (BMI) of 31.0 to 31.9 in adult ? She is encouraged to strive for BMI less than 29 to decrease cardiac risk. Advised to aim for at least 150 minutes of  exercise per week.  ? ?Patient was given opportunity to ask questions. Patient verbalized understanding of the plan and was able to repeat key elements of the plan. All questions were answered to their satisfaction.  ? ?I, Lori Greenland, MD, have reviewed all documentation for this visit. The documentation on 04/09/22 for the exam, diagnosis, procedures, and orders are all accurate and complete.  ? ?IF YOU HAVE BEEN REFERRED TO A SPECIALIST, IT MAY TAKE 1-2 WEEKS TO SCHEDULE/PROCESS THE REFERRAL. IF YOU HAVE NOT HEARD FROM US/SPECIALIST IN TWO WEEKS, PLEASE GIVE [place] A CALL AT [phone].  ? ?THE PATIENT IS ENCOURAGED TO PRACTICE SOCIAL DISTANCING DUE TO THE COVID-19 PANDEMIC.   ?

## 2022-04-10 LAB — BMP8+EGFR
BUN/Creatinine Ratio: 13 (ref 12–28)
BUN: 14 mg/dL (ref 8–27)
CO2: 25 mmol/L (ref 20–29)
Calcium: 10 mg/dL (ref 8.7–10.3)
Chloride: 102 mmol/L (ref 96–106)
Creatinine, Ser: 1.06 mg/dL — ABNORMAL HIGH (ref 0.57–1.00)
Glucose: 89 mg/dL (ref 70–99)
Potassium: 4.4 mmol/L (ref 3.5–5.2)
Sodium: 139 mmol/L (ref 134–144)
eGFR: 60 mL/min/{1.73_m2} (ref >59)

## 2022-04-10 LAB — HEMOGLOBIN A1C
Est. average glucose Bld gHb Est-mCnc: 123 mg/dL
Hgb A1c MFr Bld: 5.9 % — ABNORMAL HIGH (ref 4.8–5.6)

## 2022-04-10 LAB — VITAMIN D 25 HYDROXY (VIT D DEFICIENCY, FRACTURES): Vit D, 25-Hydroxy: 44.7 ng/mL (ref 30.0–100.0)

## 2022-05-07 ENCOUNTER — Other Ambulatory Visit: Payer: Self-pay | Admitting: Internal Medicine

## 2022-07-10 ENCOUNTER — Other Ambulatory Visit: Payer: Self-pay | Admitting: Internal Medicine

## 2022-08-04 ENCOUNTER — Encounter: Payer: Self-pay | Admitting: Internal Medicine

## 2022-08-04 ENCOUNTER — Ambulatory Visit (INDEPENDENT_AMBULATORY_CARE_PROVIDER_SITE_OTHER): Payer: BC Managed Care – PPO | Admitting: Internal Medicine

## 2022-08-04 VITALS — BP 138/68 | HR 63 | Temp 98.2°F | Ht 65.0 in | Wt 193.6 lb

## 2022-08-04 DIAGNOSIS — E785 Hyperlipidemia, unspecified: Secondary | ICD-10-CM

## 2022-08-04 DIAGNOSIS — I10 Essential (primary) hypertension: Secondary | ICD-10-CM

## 2022-08-04 DIAGNOSIS — E1169 Type 2 diabetes mellitus with other specified complication: Secondary | ICD-10-CM

## 2022-08-04 DIAGNOSIS — Z0000 Encounter for general adult medical examination without abnormal findings: Secondary | ICD-10-CM

## 2022-08-04 DIAGNOSIS — F5101 Primary insomnia: Secondary | ICD-10-CM | POA: Diagnosis not present

## 2022-08-04 DIAGNOSIS — E6609 Other obesity due to excess calories: Secondary | ICD-10-CM

## 2022-08-04 DIAGNOSIS — G4700 Insomnia, unspecified: Secondary | ICD-10-CM

## 2022-08-04 DIAGNOSIS — Z6832 Body mass index (BMI) 32.0-32.9, adult: Secondary | ICD-10-CM

## 2022-08-04 NOTE — Patient Instructions (Signed)

## 2022-08-04 NOTE — Progress Notes (Unsigned)
Barnet Glasgow Martin,acting as a Education administrator for [name], MD.,have documented all relevant documentation on the behalf of [name], MD,as directed by  [name], MD while in the presence of [name], MD.   Subjective:     Patient ID: [name] , female    DOB: 09/04/1961 , 61 y.o.   MRN: [id number]   Chief Complaint  Patient presents with   Annual Exam   Diabetes   Hypertension    HPI  Patient presents today for HM.  Patient reports compliance with all medications. She is no longer on meds for diabetes, per her request. She denies headaches, chest pain and shortness of breath. She admits she is not getting much exercise.  Patient goes to GYN in [place]. Patient's last pap/mammo was last year. She is due for her next diabetic eye exam in January 2024.   Diabetes She presents for her follow-up diabetic visit. She has type 2 diabetes mellitus. Her disease course has been stable. There are no hypoglycemic associated symptoms. Pertinent negatives for hypoglycemia include no headaches. There are no diabetic associated symptoms. Pertinent negatives for diabetes include no blurred vision and no chest pain. There are no hypoglycemic complications. There are no diabetic complications. Risk factors for coronary artery disease include diabetes mellitus, dyslipidemia, hypertension, post-menopausal and sedentary lifestyle. She is compliant with treatment most of the time. She is following a diabetic diet. She participates in exercise three times a week. She does not see a podiatrist.Eye exam is current.  Hypertension This is a chronic problem. The current episode started more than 1 year ago. The problem has been gradually improving since onset. The problem is controlled. Pertinent negatives include no blurred vision, chest pain, headaches, palpitations or shortness of breath.     Past Medical History:  Diagnosis Date   Diabetes mellitus    Hyperlipemia    Hypertension     Hypoxemia    Obesity    Vitamin D deficiency      Family History  Problem Relation Age of Onset   Heart disease Mother    Uterine cancer Mother    Cancer Father    Breast cancer Maternal Aunt    Breast cancer Cousin    Cancer Maternal Grandmother      Current Outpatient Medications:    amLODipine (NORVASC) 5 MG tablet, TAKE 1 TABLET(5 MG) BY MOUTH DAILY, Disp: 90 tablet, Rfl: 1   aspirin EC 81 MG tablet, Take 81 mg by mouth daily., Disp: , Rfl:    Cholecalciferol (VITAMIN D3) 5000 units CAPS, Take by mouth., Disp: , Rfl:    fluticasone (FLONASE) 50 MCG/ACT nasal spray, SHAKE LIQUID AND USE 1 SPRAY IN EACH NOSTRIL DAILY, Disp: 16 g, Rfl: 2   losartan (COZAAR) 100 MG tablet, TAKE 1 TABLET(100 MG) BY MOUTH DAILY, Disp: 90 tablet, Rfl: 1   Magnesium Oxide POWD, Take by mouth., Disp: , Rfl:    atorvastatin (LIPITOR) 20 MG tablet, TAKE 1 TABLET BY MOUTH EVERY DAY, Disp: 90 tablet, Rfl: 1   No Known Allergies    The patient states she uses post menopausal status for birth control. Last LMP was No LMP recorded. Patient has had a hysterectomy.. Negative for Dysmenorrhea. Negative for: breast discharge, breast lump(s), breast pain and breast self exam. Associated symptoms include abnormal vaginal bleeding. Pertinent negatives include abnormal bleeding (hematology), anxiety, decreased libido, depression, difficulty falling sleep, dyspareunia, history of infertility, nocturia, sexual dysfunction, sleep disturbances, urinary incontinence, urinary urgency, vaginal  discharge and vaginal itching. Diet regular.The patient states her exercise level is  intermittent.  . The patient's tobacco use is:  Social History   Tobacco Use  Smoking Status Former   Packs/day: 1.00   Years: 30.00   Total pack years: 30.00   Types: Cigarettes   Start date: 67   Quit date: 2016   Years since quitting: 7.6  Smokeless Tobacco Never  . She has been exposed to passive smoke. The patient's alcohol use is:   Social History   Substance and Sexual Activity  Alcohol Use Yes   Alcohol/week: 0.0 standard drinks of alcohol   Review of Systems  Constitutional: Negative.   HENT: Negative.    Eyes: Negative.  Negative for blurred vision.  Respiratory: Negative.  Negative for shortness of breath.   Cardiovascular: Negative.  Negative for chest pain and palpitations.  Gastrointestinal: Negative.   Endocrine: Negative.   Genitourinary: Negative.   Musculoskeletal: Negative.   Skin: Negative.   Allergic/Immunologic: Negative.   Neurological: Negative.  Negative for headaches.  Hematological: Negative.   Psychiatric/Behavioral: Negative.       Today's Vitals   08/04/22 1043  BP: 138/68  Pulse: 63  Temp: 98.2 F (36.8 C)  TempSrc: Lori  Weight: 193 lb 9.6 oz (87.8 kg)  Height: '5\' 5"'  (1.651 m)  PainSc: 3   PainLoc: Knee   Body mass index is 32.22 kg/m.   Objective:  Physical Exam Vitals and nursing note reviewed.  Constitutional:      Appearance: Normal appearance.  HENT:     Head: Normocephalic and atraumatic.     Right Ear: Tympanic membrane, ear canal and external ear normal.     Left Ear: Tympanic membrane, ear canal and external ear normal.     Nose: Nose normal.     Mouth/Throat:     Mouth: Mucous membranes are moist.     Pharynx: Oropharynx is clear.  Eyes:     Extraocular Movements: Extraocular movements intact.     Conjunctiva/sclera: Conjunctivae normal.     Pupils: Pupils are equal, round, and reactive to light.  Cardiovascular:     Rate and Rhythm: Normal rate and regular rhythm.     Pulses: Normal pulses.          Dorsalis pedis pulses are 2+ on the right side and 2+ on the left side.     Heart sounds: Normal heart sounds.  Pulmonary:     Effort: Pulmonary effort is normal.     Breath sounds: Normal breath sounds.  Chest:  Breasts:    Tanner Score is 5.     Right: Normal.     Left: Normal.  Abdominal:     General: Abdomen is flat. Bowel sounds are  normal.     Palpations: Abdomen is soft.  Genitourinary:    Comments: deferred Musculoskeletal:        General: Normal range of motion.     Cervical back: Normal range of motion and neck supple.     Comments: R knee swelling  Feet:     Right foot:     Protective Sensation: 5 sites tested.  5 sites sensed.     Skin integrity: Skin integrity normal.     Toenail Condition: Right toenails are normal.     Left foot:     Protective Sensation: 5 sites tested.  5 sites sensed.     Skin integrity: Skin integrity normal.     Toenail Condition: Left toenails are normal.  Skin:    General: Skin is warm and dry.  Neurological:     General: No focal deficit present.     Mental Status: She is alert and oriented to person, place, and time.  Psychiatric:        Mood and Affect: Mood normal.        Behavior: Behavior normal.      Assessment And Plan:     1. Annual physical exam Comments: A full exam was performed. Importance of monthly self breast exams was discussed with the patient.  PATIENT IS ADVISED TO GET 30-45 MINUTES REGULAR EXERCISE NO LESS THAN FOUR TO FIVE DAYS PER WEEK - BOTH WEIGHTBEARING EXERCISES AND AEROBIC ARE RECOMMENDED.  PATIENT IS ADVISED TO FOLLOW A HEALTHY DIET WITH AT LEAST SIX FRUITS/VEGGIES PER DAY, DECREASE INTAKE OF RED MEAT, AND TO INCREASE FISH INTAKE TO TWO DAYS PER WEEK.  MEATS/FISH SHOULD NOT BE FRIED, BAKED OR BROILED IS PREFERABLE.  IT IS ALSO IMPORTANT TO CUT BACK ON YOUR SUGAR INTAKE. PLEASE AVOID ANYTHING WITH ADDED SUGAR, CORN SYRUP OR OTHER SWEETENERS. IF YOU MUST USE A SWEETENER, YOU CAN TRY STEVIA. IT IS ALSO IMPORTANT TO AVOID ARTIFICIALLY SWEETENERS AND DIET BEVERAGES. LASTLY, I SUGGEST WEARING SPF 50 SUNSCREEN ON EXPOSED PARTS AND ESPECIALLY WHEN IN THE DIRECT SUNLIGHT FOR AN EXTENDED PERIOD OF TIME.  PLEASE AVOID FAST FOOD RESTAURANTS AND INCREASE YOUR WATER INTAKE.  - EKG 12-Lead - CBC no Diff - CMP14+EGFR - Hemoglobin A1c - Lipid panel  2.  Dyslipidemia associated with type 2 diabetes mellitus ([place]) Comments: Diabetic foot exam was performed. No longer on DM meds by choice. She will rto in 4 months for re-evaluation. She will c/w atorvastatin, LDL goal <70. I DISCUSSED WITH THE PATIENT AT LENGTH REGARDING THE GOALS OF GLYCEMIC CONTROL AND POSSIBLE LONG-TERM COMPLICATIONS.  I  ALSO STRESSED THE IMPORTANCE OF COMPLIANCE WITH HOME GLUCOSE MONITORING, DIETARY RESTRICTIONS INCLUDING AVOIDANCE OF SUGARY DRINKS/PROCESSED FOODS,  ALONG WITH REGULAR EXERCISE.  I  ALSO STRESSED THE IMPORTANCE OF ANNUAL EYE EXAMS, SELF FOOT CARE AND COMPLIANCE WITH OFFICE VISITS. - POCT Urinalysis Dipstick (81002) - Microalbumin / Creatinine Urine Ratio  3. Essential hypertension, benign Comments: Chronic, fair control. Goal BP<130/80. 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 She will c/w losartan 115m and amlodipine 517mqd for now. She is encouraged to incorporate more exercise into her daily routine.  - EKG 12-Lead  4. Insomnia, unspecified type She is encouraged to adopt bedtime routine. Consider magnesium and melatonin nightly. If persistent, consider use of trazodone. x  5. Class 1 obesity due to excess calories with serious comorbidity and body mass index (BMI) of 32.0 to 32.9 in adult Comments: She is encouraged to initially strive for BMI<30 to decrease cardiac risk, while aiming for at least 150 minutes of exercise per week.   Patient was given opportunity to ask questions. Patient verbalized understanding of the plan and was able to repeat key elements of the plan. All questions were answered to their satisfaction.   I, RoMaximino GreenlandMD, have reviewed all documentation for this visit. The documentation on 08/04/22 for the exam, diagnosis, procedures, and orders are all accurate and complete.   THE PATIENT IS ENCOURAGED TO PRACTICE SOCIAL DISTANCING DUE TO THE COVID-19 PANDEMIC.

## 2022-08-05 LAB — CMP14+EGFR
ALT: 39 IU/L — ABNORMAL HIGH (ref 0–32)
AST: 32 IU/L (ref 0–40)
Albumin/Globulin Ratio: 1.9 (ref 1.2–2.2)
Albumin: 5 g/dL — ABNORMAL HIGH (ref 3.9–4.9)
Alkaline Phosphatase: 138 IU/L — ABNORMAL HIGH (ref 44–121)
BUN/Creatinine Ratio: 15 (ref 12–28)
BUN: 15 mg/dL (ref 8–27)
Bilirubin Total: 0.4 mg/dL (ref 0.0–1.2)
CO2: 20 mmol/L (ref 20–29)
Calcium: 10.2 mg/dL (ref 8.7–10.3)
Chloride: 102 mmol/L (ref 96–106)
Creatinine, Ser: 0.98 mg/dL (ref 0.57–1.00)
Globulin, Total: 2.7 g/dL (ref 1.5–4.5)
Glucose: 98 mg/dL (ref 70–99)
Potassium: 4.6 mmol/L (ref 3.5–5.2)
Sodium: 141 mmol/L (ref 134–144)
Total Protein: 7.7 g/dL (ref 6.0–8.5)
eGFR: 66 mL/min/{1.73_m2} (ref >59)

## 2022-08-05 LAB — MICROALBUMIN / CREATININE URINE RATIO
Creatinine, Urine: 229.1 mg/dL
Microalb/Creat Ratio: 7 mg/g creat (ref 0–29)
Microalbumin, Urine: 16.3 ug/mL

## 2022-08-05 LAB — CBC
Hematocrit: 42.2 % (ref 34.0–46.6)
Hemoglobin: 13.6 g/dL (ref 11.1–15.9)
MCH: 26.3 pg — ABNORMAL LOW (ref 26.6–33.0)
MCHC: 32.2 g/dL (ref 31.5–35.7)
MCV: 82 fL (ref 79–97)
Platelets: 260 10*3/uL (ref 150–450)
RBC: 5.18 x10E6/uL (ref 3.77–5.28)
RDW: 13.6 % (ref 11.7–15.4)
WBC: 6.5 10*3/uL (ref 3.4–10.8)

## 2022-08-05 LAB — LIPID PANEL
Chol/HDL Ratio: 3.1 ratio (ref 0.0–4.4)
Cholesterol, Total: 160 mg/dL (ref 100–199)
HDL: 51 mg/dL (ref >39)
LDL Chol Calc (NIH): 87 mg/dL (ref 0–99)
Triglycerides: 122 mg/dL (ref 0–149)
VLDL Cholesterol Cal: 22 mg/dL (ref 5–40)

## 2022-08-05 LAB — HEMOGLOBIN A1C
Est. average glucose Bld gHb Est-mCnc: 123 mg/dL
Hgb A1c MFr Bld: 5.9 % — ABNORMAL HIGH (ref 4.8–5.6)

## 2022-08-07 ENCOUNTER — Other Ambulatory Visit: Payer: Self-pay | Admitting: Internal Medicine

## 2022-08-08 ENCOUNTER — Encounter: Payer: Self-pay | Admitting: Internal Medicine

## 2022-08-08 MED ORDER — TRAZODONE HCL 50 MG PO TABS: 50.0000 mg | ORAL_TABLET | Freq: Every day | ORAL | 1 refills | Status: DC

## 2022-08-10 LAB — POCT URINALYSIS DIPSTICK
Bilirubin, UA: NEGATIVE
Blood, UA: NEGATIVE
Glucose, UA: NEGATIVE
Ketones, UA: NEGATIVE
Leukocytes, UA: NEGATIVE
Nitrite, UA: NEGATIVE
Protein, UA: NEGATIVE
Spec Grav, UA: 1.02 (ref 1.010–1.025)
Urobilinogen, UA: 0.2 E.U./dL
pH, UA: 6 (ref 5.0–8.0)

## 2022-08-31 LAB — HM MAMMOGRAPHY: HM Mammogram: NORMAL (ref 0–4)

## 2022-11-05 ENCOUNTER — Other Ambulatory Visit: Payer: Self-pay | Admitting: Internal Medicine

## 2022-11-26 ENCOUNTER — Ambulatory Visit: Payer: BC Managed Care – PPO | Admitting: Internal Medicine

## 2022-11-26 ENCOUNTER — Encounter: Payer: Self-pay | Admitting: Internal Medicine

## 2022-11-26 VITALS — BP 130/88 | HR 79 | Temp 98.2°F | Ht 65.0 in | Wt 190.6 lb

## 2022-11-26 DIAGNOSIS — E1169 Type 2 diabetes mellitus with other specified complication: Secondary | ICD-10-CM | POA: Diagnosis not present

## 2022-11-26 DIAGNOSIS — B353 Tinea pedis: Secondary | ICD-10-CM

## 2022-11-26 DIAGNOSIS — E785 Hyperlipidemia, unspecified: Secondary | ICD-10-CM

## 2022-11-26 DIAGNOSIS — Z6831 Body mass index (BMI) 31.0-31.9, adult: Secondary | ICD-10-CM

## 2022-11-26 DIAGNOSIS — F5101 Primary insomnia: Secondary | ICD-10-CM | POA: Insufficient documentation

## 2022-11-26 DIAGNOSIS — E6609 Other obesity due to excess calories: Secondary | ICD-10-CM | POA: Insufficient documentation

## 2022-11-26 DIAGNOSIS — F17200 Nicotine dependence, unspecified, uncomplicated: Secondary | ICD-10-CM | POA: Diagnosis not present

## 2022-11-26 DIAGNOSIS — I10 Essential (primary) hypertension: Secondary | ICD-10-CM

## 2022-11-26 MED ORDER — NYSTATIN 100000 UNIT/GM EX POWD: 1.0000 | Freq: Two times a day (BID) | CUTANEOUS | 1 refills | Status: AC | PRN

## 2022-11-26 MED ORDER — FLUCONAZOLE 100 MG PO TABS: ORAL_TABLET | ORAL | 0 refills | Status: DC

## 2022-11-26 NOTE — Progress Notes (Signed)
I,Victoria T Hamilton,acting as a scribe for [name], MD.,have documented all relevant documentation on the behalf of [name], MD,as directed by  [name], MD while in the presence of [name], MD.    Subjective:     Patient ID: [name] , female    DOB: 07/15/1961 , 61 y.o.   MRN: [id number]   Chief Complaint  Patient presents with   Diabetes   Hypertension    HPI  Patient presents today for a diabetes and bp check. Patient reports compliance with her bp meds. She has been able to control her diabetes without medication.  She denies headaches, chest pain and shortness of breath. She reports feeling well today.    Diabetes She presents for her follow-up diabetic visit. She has type 2 diabetes mellitus. Her disease course has been stable. There are no hypoglycemic associated symptoms. Pertinent negatives for hypoglycemia include no headaches. There are no diabetic associated symptoms. Pertinent negatives for diabetes include no blurred vision and no chest pain. There are no hypoglycemic complications. There are no diabetic complications. Risk factors for coronary artery disease include diabetes mellitus, dyslipidemia, hypertension, post-menopausal and sedentary lifestyle. She is compliant with treatment most of the time. She is following a diabetic diet. She participates in exercise three times a week. She does not see a podiatrist.Eye exam is current.  Hypertension This is a chronic problem. The current episode started more than 1 year ago. The problem has been gradually improving since onset. The problem is controlled. Pertinent negatives include no blurred vision, chest pain, headaches, palpitations or shortness of breath.     Past Medical History:  Diagnosis Date   Diabetes mellitus    Hyperlipemia    Hypertension    Hypoxemia    Obesity    Vitamin D deficiency      Family History  Problem Relation Age of Onset   Heart disease Mother     Uterine cancer Mother    Cancer Father    Breast cancer Maternal Aunt    Breast cancer Cousin    Cancer Maternal Grandmother      Current Outpatient Medications:    amLODipine (NORVASC) 5 MG tablet, TAKE 1 TABLET(5 MG) BY MOUTH DAILY, Disp: 90 tablet, Rfl: 1   aspirin EC 81 MG tablet, Take 81 mg by mouth daily., Disp: , Rfl:    atorvastatin (LIPITOR) 20 MG tablet, TAKE 1 TABLET BY MOUTH EVERY DAY, Disp: 90 tablet, Rfl: 1   Cholecalciferol (VITAMIN D3) 5000 units CAPS, Take by mouth., Disp: , Rfl:    fluconazole (DIFLUCAN) 100 MG tablet, One tab once weekly, Disp: 4 tablet, Rfl: 0   fluticasone (FLONASE) 50 MCG/ACT nasal spray, SHAKE LIQUID AND USE 1 SPRAY IN EACH NOSTRIL DAILY, Disp: 16 g, Rfl: 2   losartan (COZAAR) 100 MG tablet, TAKE 1 TABLET(100 MG) BY MOUTH DAILY, Disp: 90 tablet, Rfl: 1   nystatin powder, Apply 1 Application topically 2 (two) times daily as needed., Disp: 30 g, Rfl: 1   Magnesium Oxide POWD, Take by mouth. (Patient not taking: Reported on 11/26/2022), Disp: , Rfl:    traZODone (DESYREL) 50 MG tablet, Take 1 tablet (50 mg total) by mouth at bedtime. (Patient not taking: Reported on 11/26/2022), Disp: 30 tablet, Rfl: 1   No Known Allergies   Review of Systems  Constitutional: Negative.   Eyes:  Negative for blurred vision.  Respiratory: Negative.  Negative for shortness of breath.   Cardiovascular: Negative.  Negative for chest pain and palpitations.  Skin:  Positive for rash.  Neurological: Negative.  Negative for headaches.  Psychiatric/Behavioral: Negative.       Today's Vitals   11/26/22 1207 11/26/22 1253  BP: (!) 130/98 130/88  Pulse: 79   Temp: 98.2 F (36.8 C)   SpO2: 98%   Weight: 190 lb 9.6 oz (86.5 kg)   Height: _0  (1.651 m)    Body mass index is 31.72 kg/m.  Wt Readings from Last 3 Encounters:  11/26/22 190 lb 9.6 oz (86.5 kg)  08/04/22 193 lb 9.6 oz (87.8 kg)  04/09/22 193 lb 3.2 oz (87.6 kg)    Objective:  Physical Exam Vitals and  nursing note reviewed.  Constitutional:      Appearance: Normal appearance.  HENT:     Head: Normocephalic and atraumatic.     Nose:     Comments: Masked     Mouth/Throat:     Comments: Masked  Eyes:     Extraocular Movements: Extraocular movements intact.  Cardiovascular:     Rate and Rhythm: Normal rate and regular rhythm.     Heart sounds: Normal heart sounds.  Pulmonary:     Effort: Pulmonary effort is normal.     Breath sounds: Normal breath sounds.  Musculoskeletal:     Cervical back: Normal range of motion.  Feet:     Right foot:     Skin integrity: Dry skin present.     Comments: Scaly skin on sole, lateral right foot No erythema noted Skin:    General: Skin is warm.  Neurological:     General: No focal deficit present.     Mental Status: She is alert.  Psychiatric:        Mood and Affect: Mood normal.        Behavior: Behavior normal.       Assessment And Plan:     1. Type 2 diabetes mellitus with hyperlipidemia (HCC) Comments: Chronic, DM controlled w/o meds. LDL goal <70.  She will c/w atorvastatin 62m daily. - CMP14+EGFR - Hemoglobin A1c - Lipid panel  2. Essential hypertension, benign Comments: Chronic, fair control. Goal BP<120/80. She will c/w losartan 1068mdaily for now, will consider changing to valsartan 16019mf BP persistently elevated. - CMP14+EGFR  3. Tinea pedis of right foot Comments: She agrees to start weekly diflucan, advised to hold statin once weekly as well. I will also send rx nystatin powder to apply to affected area bid prn.  4. Tobacco use disorder Comments: She agrees to LDCT to screen for lung cancer. She has greater than 20-packyear h/o tobacco use. - CT CHEST LUNG CA SCREEN LOW DOSE W/O CM; Future  5. Class 1 obesity due to excess calories with serious comorbidity and body mass index (BMI) of 31.0 to 31.9 in adult She is encouraged to strive for BMI less than 30 to decrease cardiac risk. Advised to aim for at least 150  minutes of exercise per week.    Patient was given opportunity to ask questions. Patient verbalized understanding of the plan and was able to repeat key elements of the plan. All questions were answered to their satisfaction.   I, RobMaximino GreenlandD, have reviewed all documentation for this visit. The documentation on 11/26/22 for the exam, diagnosis, procedures, and orders are all accurate and complete.   IF YOU HAVE BEEN REFERRED TO A SPECIALIST, IT MAY TAKE 1-2 WEEKS TO SCHEDULE/PROCESS THE REFERRAL. IF YOU HAVE NOT HEARD FROM  US/SPECIALIST IN TWO WEEKS, PLEASE GIVE [place] A CALL AT [phone].   THE PATIENT IS ENCOURAGED TO PRACTICE SOCIAL DISTANCING DUE TO THE COVID-19 PANDEMIC.

## 2022-11-26 NOTE — Patient Instructions (Addendum)
Stop atorvastatin while on fluconazole.   Type 2 Diabetes Mellitus, Diagnosis, Adult Type 2 diabetes (type 2 diabetes mellitus) is a long-term (chronic) disease. It may happen when there is one or both of these problems: The pancreas does not make enough insulin. The body does not react in a normal way to insulin that it makes. Insulin lets sugars go into cells in your body. If you have type 2 diabetes, sugars cannot get into your cells. Sugars build up in the blood. This causes high blood sugar. What are the causes? The exact cause of this condition is not known. What increases the risk? Having type 2 diabetes in your family. Being overweight or very overweight. Not being active. Your body not reacting in a normal way to the insulin it makes. Having higher than normal blood sugar over time. Having a type of diabetes when you were pregnant. Having a condition that causes small fluid-filled sacs on your ovaries. What are the signs or symptoms? At first, you may have no symptoms. You will get symptoms slowly. They may include: More thirst than normal. More hunger than normal. Needing to pee more than normal. Losing weight without trying. Feeling tired. Feeling weak. Seeing things blurry. Dark patches on your skin. How is this treated? This condition may be treated by a diabetes expert. You may need to: Follow an eating plan made by a food expert (dietitian). Get regular exercise. Find ways to deal with stress. Check blood sugar as often as told. Take medicines. Your doctor will set treatment goals for you. Your blood sugar should be at these levels: Before meals: 80-130 mg/dL (5.7-3.2 mmol/L). After meals: below 180 mg/dL (10 mmol/L). Over the last 2-3 months: less than 7%. Follow these instructions at home: Medicines Take your diabetes medicines or insulin every day. Take medicines as told to help you prevent other problems caused by this condition. You may  need: Aspirin. Medicine to lower cholesterol. Medicine to control blood pressure. Questions to ask your doctor Should I meet with a diabetes educator? What medicines do I need, and when should I take them? What will I need to treat my condition at home? When should I check my blood sugar? Where can I find a support group? Who can I call if I have questions? When is my next doctor visit? General instructions Take over-the-counter and prescription medicines only as told by your doctor. Keep all follow-up visits. Where to find more information For help and guidance and more information about diabetes, please go to: American Diabetes Association (ADA): www.diabetes.org American Association of Diabetes Care and Education Specialists (ADCES): www.diabeteseducator.org International Diabetes Federation (IDF): DCOnly.dk Contact a doctor if: Your blood sugar is at or above 240 mg/dL (20.2 mmol/L) for 2 days in a row. You have been sick for 2 days or more, and you are not getting better. You have had a fever for 2 days or more, and you are not getting better. You have any of these problems for more than 6 hours: You cannot eat or drink. You feel like you may vomit. You vomit. You have watery poop (diarrhea). Get help right away if: Your blood sugar is lower than 54 mg/dL (3 mmol/L). You feel mixed up (confused). You have trouble thinking clearly. You have trouble breathing. You have medium or large ketone levels in your pee. These symptoms may be an emergency. Get help right away. Call your local emergency services (911 in the [place] Do not wait to see if the symptoms  will go away. Do not drive yourself to the hospital. Summary Type 2 diabetes is a long-term disease. Your pancreas may not make enough insulin, or your body may not react in a normal way to insulin that it makes. This condition is treated with an eating plan, lifestyle changes, and medicines. Your doctor will set  treatment goals for you. These will help you keep your blood sugar in a healthy range. Keep all follow-up visits. This information is not intended to replace advice given to you by your health care provider. Make sure you discuss any questions you have with your health care provider. Document Revised: 03/03/2021 Document Reviewed: 03/03/2021 Elsevier Patient Education  2023 Elsevier Inc.  Hypertension, Adult Hypertension is another name for high blood pressure. High blood pressure forces your heart to work harder to pump blood. This can cause problems over time. There are two numbers in a blood pressure reading. There is a top number (systolic) over a bottom number (diastolic). It is best to have a blood pressure that is below 120/80. What are the causes? The cause of this condition is not known. Some other conditions can lead to high blood pressure. What increases the risk? Some lifestyle factors can make you more likely to develop high blood pressure: Smoking. Not getting enough exercise or physical activity. Being overweight. Having too much fat, sugar, calories, or salt (sodium) in your diet. Drinking too much alcohol. Other risk factors include: Having any of these conditions: Heart disease. Diabetes. High cholesterol. Kidney disease. Obstructive sleep apnea. Having a family history of high blood pressure and high cholesterol. Age. The risk increases with age. Stress. What are the signs or symptoms? High blood pressure may not cause symptoms. Very high blood pressure (hypertensive crisis) may cause: Headache. Fast or uneven heartbeats (palpitations). Shortness of breath. Nosebleed. Vomiting or feeling like you may vomit (nauseous). Changes in how you see. Very bad chest pain. Feeling dizzy. Seizures. How is this treated? This condition is treated by making healthy lifestyle changes, such as: Eating healthy foods. Exercising more. Drinking less alcohol. Your doctor  may prescribe medicine if lifestyle changes do not help enough and if: Your top number is above 130. Your bottom number is above 80. Your personal target blood pressure may vary. Follow these instructions at home: Eating and drinking  If told, follow the DASH eating plan. To follow this plan: Fill one half of your plate at each meal with fruits and vegetables. Fill one fourth of your plate at each meal with whole grains. Whole grains include whole-wheat pasta, brown rice, and whole-grain bread. Eat or drink low-fat dairy products, such as skim milk or low-fat yogurt. Fill one fourth of your plate at each meal with low-fat (lean) proteins. Low-fat proteins include fish, chicken without skin, eggs, beans, and tofu. Avoid fatty meat, cured and processed meat, or chicken with skin. Avoid pre-made or processed food. Limit the amount of salt in your diet to less than 1,500 mg each day. Do not drink alcohol if: Your doctor tells you not to drink. You are pregnant, may be pregnant, or are planning to become pregnant. If you drink alcohol: Limit how much you have to: 0-1 drink a day for women. 0-2 drinks a day for men. Know how much alcohol is in your drink. In the [place] one drink equals one 12 oz bottle of beer (355 mL), one 5 oz glass of wine (148 mL), or one 1 oz glass of hard liquor (44 mL). Lifestyle  Work with your doctor to stay at a healthy weight or to lose weight. Ask your doctor what the best weight is for you. Get at least 30 minutes of exercise that causes your heart to beat faster (aerobic exercise) most days of the week. This may include walking, swimming, or biking. Get at least 30 minutes of exercise that strengthens your muscles (resistance exercise) at least 3 days a week. This may include lifting weights or doing Pilates. Do not smoke or use any products that contain nicotine or tobacco. If you need help quitting, ask your doctor. Check your blood pressure at home as told by  your doctor. Keep all follow-up visits. Medicines Take over-the-counter and prescription medicines only as told by your doctor. Follow directions carefully. Do not skip doses of blood pressure medicine. The medicine does not work as well if you skip doses. Skipping doses also puts you at risk for problems. Ask your doctor about side effects or reactions to medicines that you should watch for. Contact a doctor if: You think you are having a reaction to the medicine you are taking. You have headaches that keep coming back. You feel dizzy. You have swelling in your ankles. You have trouble with your vision. Get help right away if: You get a very bad headache. You start to feel mixed up (confused). You feel weak or numb. You feel faint. You have very bad pain in your: Chest. Belly (abdomen). You vomit more than once. You have trouble breathing. These symptoms may be an emergency. Get help right away. Call 911. Do not wait to see if the symptoms will go away. Do not drive yourself to the hospital. Summary Hypertension is another name for high blood pressure. High blood pressure forces your heart to work harder to pump blood. For most people, a normal blood pressure is less than 120/80. Making healthy choices can help lower blood pressure. If your blood pressure does not get lower with healthy choices, you may need to take medicine. This information is not intended to replace advice given to you by your health care provider. Make sure you discuss any questions you have with your health care provider. Document Revised: 09/25/2021 Document Reviewed: 09/25/2021 Elsevier Patient Education  2023 [employer].

## 2022-11-27 LAB — CMP14+EGFR
ALT: 35 IU/L — ABNORMAL HIGH (ref 0–32)
AST: 31 IU/L (ref 0–40)
Albumin/Globulin Ratio: 1.8 (ref 1.2–2.2)
Albumin: 4.8 g/dL (ref 3.9–4.9)
Alkaline Phosphatase: 126 IU/L — ABNORMAL HIGH (ref 44–121)
BUN/Creatinine Ratio: 13 (ref 12–28)
BUN: 13 mg/dL (ref 8–27)
Bilirubin Total: 0.3 mg/dL (ref 0.0–1.2)
CO2: 24 mmol/L (ref 20–29)
Calcium: 9.6 mg/dL (ref 8.7–10.3)
Chloride: 103 mmol/L (ref 96–106)
Creatinine, Ser: 0.97 mg/dL (ref 0.57–1.00)
Globulin, Total: 2.7 g/dL (ref 1.5–4.5)
Glucose: 97 mg/dL (ref 70–99)
Potassium: 4.3 mmol/L (ref 3.5–5.2)
Sodium: 142 mmol/L (ref 134–144)
Total Protein: 7.5 g/dL (ref 6.0–8.5)
eGFR: 66 mL/min/{1.73_m2} (ref >59)

## 2022-11-27 LAB — LIPID PANEL
Chol/HDL Ratio: 2.8 ratio (ref 0.0–4.4)
Cholesterol, Total: 146 mg/dL (ref 100–199)
HDL: 52 mg/dL (ref >39)
LDL Chol Calc (NIH): 75 mg/dL (ref 0–99)
Triglycerides: 105 mg/dL (ref 0–149)
VLDL Cholesterol Cal: 19 mg/dL (ref 5–40)

## 2022-11-27 LAB — HEMOGLOBIN A1C
Est. average glucose Bld gHb Est-mCnc: 126 mg/dL
Hgb A1c MFr Bld: 6 % — ABNORMAL HIGH (ref 4.8–5.6)

## 2023-01-25 ENCOUNTER — Other Ambulatory Visit: Payer: Self-pay

## 2023-01-25 ENCOUNTER — Other Ambulatory Visit: Payer: Self-pay | Admitting: Internal Medicine

## 2023-01-25 ENCOUNTER — Encounter: Payer: Self-pay | Admitting: Internal Medicine

## 2023-01-25 MED ORDER — LOSARTAN POTASSIUM 100 MG PO TABS: ORAL_TABLET | ORAL | 1 refills | Status: DC

## 2023-01-27 ENCOUNTER — Other Ambulatory Visit: Payer: Self-pay | Admitting: Internal Medicine

## 2023-02-03 ENCOUNTER — Other Ambulatory Visit: Payer: Self-pay | Admitting: Internal Medicine

## 2023-03-15 ENCOUNTER — Encounter: Payer: Self-pay | Admitting: Internal Medicine

## 2023-03-31 ENCOUNTER — Ambulatory Visit: Payer: BC Managed Care – PPO | Admitting: Internal Medicine

## 2023-03-31 ENCOUNTER — Encounter: Payer: Self-pay | Admitting: Internal Medicine

## 2023-03-31 ENCOUNTER — Other Ambulatory Visit: Payer: Self-pay

## 2023-03-31 VITALS — BP 130/84 | HR 74 | Temp 98.2°F | Ht 65.0 in | Wt 191.2 lb

## 2023-03-31 DIAGNOSIS — I700 Atherosclerosis of aorta: Secondary | ICD-10-CM

## 2023-03-31 DIAGNOSIS — I10 Essential (primary) hypertension: Secondary | ICD-10-CM

## 2023-03-31 DIAGNOSIS — E785 Hyperlipidemia, unspecified: Secondary | ICD-10-CM | POA: Diagnosis not present

## 2023-03-31 DIAGNOSIS — E1169 Type 2 diabetes mellitus with other specified complication: Secondary | ICD-10-CM

## 2023-03-31 DIAGNOSIS — E6609 Other obesity due to excess calories: Secondary | ICD-10-CM

## 2023-03-31 DIAGNOSIS — Z6831 Body mass index (BMI) 31.0-31.9, adult: Secondary | ICD-10-CM

## 2023-03-31 DIAGNOSIS — K219 Gastro-esophageal reflux disease without esophagitis: Secondary | ICD-10-CM

## 2023-03-31 MED ORDER — FLUTICASONE PROPIONATE 50 MCG/ACT NA SUSP: NASAL | 2 refills | Status: DC

## 2023-03-31 MED ORDER — LOSARTAN POTASSIUM 100 MG PO TABS: ORAL_TABLET | ORAL | 1 refills | Status: DC

## 2023-03-31 MED ORDER — FAMOTIDINE 20 MG PO TABS: 20.0000 mg | ORAL_TABLET | Freq: Two times a day (BID) | ORAL | 1 refills | Status: DC

## 2023-03-31 NOTE — Patient Instructions (Signed)

## 2023-03-31 NOTE — Progress Notes (Signed)
I,Victoria T Hamilton,acting as a scribe for [name], MD.,have documented all relevant documentation on the behalf of [name], MD,as directed by  [name], MD while in the presence of [name], MD.    Subjective:     Patient ID: [name] , female    DOB: 12-20-1961 , 62 y.o.   MRN: [id number]   Chief Complaint  Patient presents with   Diabetes   Hypertension    HPI  Patient presents today for a diabetes and bp check. Patient reports compliance with her bp meds. Previously, she has been able to control her diabetes without medication.  She denies headaches, chest pain and shortness of breath. She thinks she wants to resume Ozempic due to weight gain.     Diabetes She presents for her follow-up diabetic visit. She has type 2 diabetes mellitus. Her disease course has been stable. There are no hypoglycemic associated symptoms. Pertinent negatives for hypoglycemia include no headaches. There are no diabetic associated symptoms. Pertinent negatives for diabetes include no blurred vision and no chest pain. There are no hypoglycemic complications. There are no diabetic complications. Risk factors for coronary artery disease include diabetes mellitus, dyslipidemia, hypertension, post-menopausal and sedentary lifestyle. She is compliant with treatment most of the time. She is following a diabetic diet. She participates in exercise three times a week. She does not see a podiatrist.Eye exam is current.  Hypertension This is a chronic problem. The current episode started more than 1 year ago. The problem has been gradually improving since onset. The problem is controlled. Pertinent negatives include no blurred vision, chest pain, headaches, palpitations or shortness of breath.     Past Medical History:  Diagnosis Date   Diabetes mellitus    Hyperlipemia    Hypertension    Hypoxemia    Obesity    Vitamin D deficiency      Family History  Problem Relation  Age of Onset   Heart disease Mother    Uterine cancer Mother    Cancer Father    Breast cancer Maternal Aunt    Breast cancer Cousin    Cancer Maternal Grandmother      Current Outpatient Medications:    amLODipine (NORVASC) 5 MG tablet, TAKE 1 TABLET(5 MG) BY MOUTH DAILY, Disp: 90 tablet, Rfl: 1   aspirin EC 81 MG tablet, Take 81 mg by mouth daily., Disp: , Rfl:    atorvastatin (LIPITOR) 20 MG tablet, TAKE 1 TABLET BY MOUTH EVERY DAY, Disp: 90 tablet, Rfl: 1   Cholecalciferol (VITAMIN D3) 5000 units CAPS, Take by mouth., Disp: , Rfl:    famotidine (PEPCID) 20 MG tablet, Take 1 tablet (20 mg total) by mouth 2 (two) times daily., Disp: 60 tablet, Rfl: 1   fluticasone (FLONASE) 50 MCG/ACT nasal spray, SHAKE LIQUID AND USE 1 SPRAY IN EACH NOSTRIL DAILY, Disp: 16 g, Rfl: 2   losartan (COZAAR) 100 MG tablet, TAKE 1 TABLET(100 MG) BY MOUTH DAILY, Disp: 90 tablet, Rfl: 1   nystatin powder, Apply 1 Application topically 2 (two) times daily as needed., Disp: 30 g, Rfl: 1   fluconazole (DIFLUCAN) 100 MG tablet, One tab once weekly (Patient not taking: Reported on 03/31/2023), Disp: 4 tablet, Rfl: 0   Magnesium Oxide POWD, Take by mouth. (Patient not taking: Reported on 11/26/2022), Disp: , Rfl:    traZODone (DESYREL) 50 MG tablet, Take 1 tablet (50 mg total) by mouth at bedtime. (Patient not taking: Reported on 11/26/2022), Disp: 30  tablet, Rfl: 1   No Known Allergies   Review of Systems  Constitutional: Negative.   Eyes:  Negative for blurred vision.  Respiratory: Negative.  Negative for shortness of breath.   Cardiovascular: Negative.  Negative for chest pain and palpitations.  Gastrointestinal: Negative.        She c/o frequent gas bubbles in her stomach. There is some associated discomfort at times. Unable to state what triggers her sx.  She has tried some Pepcid w/ some improvement in her sx. Denies having n/v/d.    Musculoskeletal: Negative.   Skin: Negative.   Neurological: Negative.   Negative for headaches.  Psychiatric/Behavioral: Negative.       Today's Vitals   03/31/23 1108  BP: 130/84  Pulse: 74  Temp: 98.2 F (36.8 C)  SpO2: 98%  Weight: 191 lb 3.2 oz (86.7 kg)  Height: 5\' 5"  (1.651 m)   Body mass index is 31.82 kg/m.  Wt Readings from Last 3 Encounters:  03/31/23 191 lb 3.2 oz (86.7 kg)  11/26/22 190 lb 9.6 oz (86.5 kg)  08/04/22 193 lb 9.6 oz (87.8 kg)    Objective:  Physical Exam Vitals and nursing note reviewed.  Constitutional:      Appearance: Normal appearance.  HENT:     Head: Normocephalic and atraumatic.     Nose:     Comments: Masked     Mouth/Throat:     Comments: Masked  Eyes:     Extraocular Movements: Extraocular movements intact.  Cardiovascular:     Rate and Rhythm: Normal rate and regular rhythm.     Heart sounds: Normal heart sounds.  Pulmonary:     Effort: Pulmonary effort is normal.     Breath sounds: Normal breath sounds.  Musculoskeletal:     Cervical back: Normal range of motion.  Skin:    General: Skin is warm.  Neurological:     General: No focal deficit present.     Mental Status: She is alert.  Psychiatric:        Mood and Affect: Mood normal.        Behavior: Behavior normal.      Assessment And Plan:     1. Type 2 diabetes mellitus with hyperlipidemia Comments: Chronic, LDL goal < 70. I do plan to resume semaglutide, for cardiovascular benefit. Due to GERd, may need to stay at 0.5mg  dose. - CMP14+EGFR - Hemoglobin A1c - Microalbumin / Creatinine Urine Ratio - Ambulatory referral to Cardiology  2. Essential hypertension, benign Comments: Chronic, fair control. Goal BP<120/80.  She will c/w losartan 100mg  and amlodipine 5mg  daily for now.  3. Gastroesophageal reflux disease without esophagitis Comments: I will start famotidine 20mg  twice daily. May need to switch to PPI therapy if her sx persist.  4. Aortic atherosclerosis Comments: Chronic, LDL goal < 70. She will c/w atorvastatin 20mg  daily  for now. Encouraged to decrease intake of fried foods. Will refer to Cardiology for risk assessment  5. Class 1 obesity due to excess calories with serious comorbidity and body mass index (BMI) of 31.0 to 31.9 in adult Comments: She is encouraged to aim for at least 150 minutes of exercise/week.   Patient was given opportunity to ask questions. Patient verbalized understanding of the plan and was able to repeat key elements of the plan. All questions were answered to their satisfaction.   I, [name], MD, have reviewed all documentation for this visit. The documentation on 03/31/23 for the exam, diagnosis, procedures, and orders  are all accurate and complete.   IF YOU HAVE BEEN REFERRED TO A SPECIALIST, IT MAY TAKE 1-2 WEEKS TO SCHEDULE/PROCESS THE REFERRAL. IF YOU HAVE NOT HEARD FROM US/SPECIALIST IN TWO WEEKS, PLEASE GIVE [place] A CALL AT [phone] X [phone].   THE PATIENT IS ENCOURAGED TO PRACTICE SOCIAL DISTANCING DUE TO THE COVID-19 PANDEMIC.

## 2023-04-01 ENCOUNTER — Other Ambulatory Visit: Payer: Self-pay | Admitting: Internal Medicine

## 2023-04-01 LAB — CMP14+EGFR
ALT: 39 IU/L — ABNORMAL HIGH (ref 0–32)
AST: 36 IU/L (ref 0–40)
Albumin/Globulin Ratio: 1.9 (ref 1.2–2.2)
Albumin: 4.9 g/dL (ref 3.9–4.9)
Alkaline Phosphatase: 147 IU/L — ABNORMAL HIGH (ref 44–121)
BUN/Creatinine Ratio: 10 — ABNORMAL LOW (ref 12–28)
BUN: 10 mg/dL (ref 8–27)
Bilirubin Total: 0.3 mg/dL (ref 0.0–1.2)
CO2: 26 mmol/L (ref 20–29)
Calcium: 10.1 mg/dL (ref 8.7–10.3)
Chloride: 103 mmol/L (ref 96–106)
Creatinine, Ser: 1.01 mg/dL — ABNORMAL HIGH (ref 0.57–1.00)
Globulin, Total: 2.6 g/dL (ref 1.5–4.5)
Glucose: 93 mg/dL (ref 70–99)
Potassium: 4.6 mmol/L (ref 3.5–5.2)
Sodium: 141 mmol/L (ref 134–144)
Total Protein: 7.5 g/dL (ref 6.0–8.5)
eGFR: 63 mL/min/{1.73_m2} (ref >59)

## 2023-04-01 LAB — HEMOGLOBIN A1C
Est. average glucose Bld gHb Est-mCnc: 126 mg/dL
Hgb A1c MFr Bld: 6 % — ABNORMAL HIGH (ref 4.8–5.6)

## 2023-04-01 LAB — MICROALBUMIN / CREATININE URINE RATIO
Creatinine, Urine: 386.4 mg/dL
Microalb/Creat Ratio: 7 mg/g creat (ref 0–29)
Microalbumin, Urine: 27.2 ug/mL

## 2023-04-06 LAB — ALKALINE PHOSPHATASE: Alkaline Phosphatase: 150 IU/L — ABNORMAL HIGH (ref 44–121)

## 2023-04-06 LAB — SPECIMEN STATUS REPORT

## 2023-04-08 ENCOUNTER — Other Ambulatory Visit: Payer: Self-pay | Admitting: Internal Medicine

## 2023-04-08 DIAGNOSIS — R748 Abnormal levels of other serum enzymes: Secondary | ICD-10-CM

## 2023-04-15 LAB — HM DIABETES EYE EXAM

## 2023-04-20 ENCOUNTER — Other Ambulatory Visit: Payer: Self-pay | Admitting: Internal Medicine

## 2023-04-23 ENCOUNTER — Encounter: Payer: Self-pay | Admitting: Internal Medicine

## 2023-04-25 LAB — ALKALINE PHOSPHATASE, ISOENZYMES
Alkaline Phosphatase: 137 IU/L — ABNORMAL HIGH (ref 44–121)
BONE FRACTION: 26 % (ref 14–68)
INTESTINAL FRAC.: 2 % (ref 0–18)
LIVER FRACTION: 72 % (ref 18–85)

## 2023-04-25 LAB — VITAMIN D 25 HYDROXY (VIT D DEFICIENCY, FRACTURES): Vit D, 25-Hydroxy: 58 ng/mL (ref 30.0–100.0)

## 2023-04-26 ENCOUNTER — Encounter: Payer: Self-pay | Admitting: Internal Medicine

## 2023-06-28 ENCOUNTER — Other Ambulatory Visit: Payer: Self-pay | Admitting: Internal Medicine

## 2023-08-10 ENCOUNTER — Ambulatory Visit (INDEPENDENT_AMBULATORY_CARE_PROVIDER_SITE_OTHER): Payer: BC Managed Care – PPO | Admitting: Internal Medicine

## 2023-08-10 ENCOUNTER — Encounter: Payer: Self-pay | Admitting: Internal Medicine

## 2023-08-10 VITALS — BP 130/84 | HR 64 | Temp 98.3°F | Ht 65.0 in | Wt 193.2 lb

## 2023-08-10 DIAGNOSIS — I10 Essential (primary) hypertension: Secondary | ICD-10-CM

## 2023-08-10 DIAGNOSIS — E1169 Type 2 diabetes mellitus with other specified complication: Secondary | ICD-10-CM | POA: Diagnosis not present

## 2023-08-10 DIAGNOSIS — E785 Hyperlipidemia, unspecified: Secondary | ICD-10-CM | POA: Diagnosis not present

## 2023-08-10 DIAGNOSIS — Z6832 Body mass index (BMI) 32.0-32.9, adult: Secondary | ICD-10-CM

## 2023-08-10 DIAGNOSIS — E6609 Other obesity due to excess calories: Secondary | ICD-10-CM

## 2023-08-10 DIAGNOSIS — Z0000 Encounter for general adult medical examination without abnormal findings: Secondary | ICD-10-CM

## 2023-08-10 DIAGNOSIS — K219 Gastro-esophageal reflux disease without esophagitis: Secondary | ICD-10-CM

## 2023-08-10 LAB — POCT URINALYSIS DIPSTICK
Bilirubin, UA: NEGATIVE
Blood, UA: NEGATIVE
Glucose, UA: NEGATIVE
Ketones, UA: NEGATIVE
Leukocytes, UA: NEGATIVE
Nitrite, UA: NEGATIVE
Protein, UA: NEGATIVE
Spec Grav, UA: 1.02 (ref 1.010–1.025)
Urobilinogen, UA: 0.2 E.U./dL
pH, UA: 6 (ref 5.0–8.0)

## 2023-08-10 NOTE — Assessment & Plan Note (Signed)
Chronic, diabetic foot exam was performed.  She is not on any meds, she was reminded of cardiac benefits with use of  GLP-1 agonists. She is not interested in therapy at this time. I DISCUSSED WITH THE PATIENT AT LENGTH REGARDING THE GOALS OF GLYCEMIC CONTROL AND POSSIBLE LONG-TERM COMPLICATIONS.  I  ALSO STRESSED THE IMPORTANCE OF COMPLIANCE WITH HOME GLUCOSE MONITORING, DIETARY RESTRICTIONS INCLUDING AVOIDANCE OF SUGARY DRINKS/PROCESSED FOODS,  ALONG WITH REGULAR EXERCISE.  I  ALSO STRESSED THE IMPORTANCE OF ANNUAL EYE EXAMS, SELF FOOT CARE AND COMPLIANCE WITH OFFICE VISITS.

## 2023-08-10 NOTE — Progress Notes (Signed)
I,[name], CMA,acting as a Neurosurgeon for [name], MD.,have documented all relevant documentation on the behalf of [name], MD,as directed by  [name], MD while in the presence of [name], MD.  Subjective:    Patient ID: [name] , female    DOB: 06/04/1961 , 62 y.o.   MRN: [id number]  Chief Complaint  Patient presents with   Annual Exam   Hypertension   Diabetes    HPI  Patient presents today for annual.  She is followed by GYN in [place], [place].  Patient reports compliance with all medications. She is no longer on meds for diabetes, per her request. She denies headaches, chest pain and shortness of breath.   She reports wanting more clarity on why she is on " so many" medications.   She has been seen by Cardiology in [place]. She did have an EKG completed on 05/28/23 at her initial Cardiology visit.   She adds she recently had echo as well. Currently with monitor to eval for palpitations.   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  ABNORMAL     Diabetes She presents for her follow-up diabetic visit. She has type 2 diabetes mellitus. Her disease course has been stable. There are no hypoglycemic associated symptoms. Pertinent negatives for hypoglycemia include no headaches. There are no diabetic associated symptoms. Pertinent negatives for diabetes include no blurred vision and no chest pain. There are no hypoglycemic complications. There are no diabetic complications. Risk factors for coronary artery disease include diabetes mellitus, dyslipidemia, hypertension, post-menopausal and sedentary lifestyle. She is compliant with treatment most of the time. She is following a diabetic diet. She participates in exercise three times a week. She does not see a podiatrist.Eye exam is current.  Hypertension This is a chronic problem. The current episode started more than 1 year ago. The  problem has been gradually improving since onset. The problem is controlled. Pertinent negatives include no blurred vision, chest pain, headaches, palpitations or shortness of breath.     Past Medical History:  Diagnosis Date   Diabetes mellitus    Hyperlipemia    Hypertension    Hypoxemia    Obesity    Vitamin D deficiency      Family History  Problem Relation Age of Onset   Heart disease Mother    Uterine cancer Mother    Cancer Father    Breast cancer Maternal Aunt    Breast cancer Cousin    Cancer Maternal Grandmother      Current Outpatient Medications:    amLODipine (NORVASC) 5 MG tablet, TAKE 1 TABLET(5 MG) BY MOUTH DAILY, Disp: 90 tablet, Rfl: 1   aspirin EC 81 MG tablet, Take 81 mg by mouth daily., Disp: , Rfl:    atorvastatin (LIPITOR) 20 MG tablet, TAKE 1 TABLET BY MOUTH EVERY DAY, Disp: 90 tablet, Rfl: 1   Cholecalciferol (VITAMIN D3) 5000 units CAPS, Take by mouth., Disp: , Rfl:    famotidine (PEPCID) 20 MG tablet, TAKE 1 TABLET(20 MG) BY MOUTH TWICE DAILY, Disp: 180 tablet, Rfl: 0   fluticasone (FLONASE) 50 MCG/ACT nasal spray, SHAKE LIQUID AND USE 1 SPRAY IN EACH NOSTRIL DAILY, Disp: 16 g, Rfl: 2   losartan (COZAAR) 100 MG tablet, TAKE 1 TABLET(100 MG) BY MOUTH DAILY, Disp: 90 tablet, Rfl: 1   nystatin powder, Apply 1 Application topically 2 (two) times daily as needed., Disp: 30 g, Rfl: 1  Magnesium Oxide POWD, Take by mouth. (Patient not taking: Reported on 11/26/2022), Disp: , Rfl:    No Known Allergies    The patient states she uses post menopausal status for birth control. No LMP recorded. Patient has had a hysterectomy.. Negative for Dysmenorrhea. Negative for: breast discharge, breast lump(s), breast pain and breast self exam. Associated symptoms include abnormal vaginal bleeding. Pertinent negatives include abnormal bleeding (hematology), anxiety, decreased libido, depression, difficulty falling sleep, dyspareunia, history of infertility, nocturia, sexual  dysfunction, sleep disturbances, urinary incontinence, urinary urgency, vaginal discharge and vaginal itching. Diet regular.The patient states her exercise level is    . The patient's tobacco use is:  Social History   Tobacco Use  Smoking Status Former   Current packs/day: 0.00   Average packs/day: 1 pack/day for 31.0 years (31.0 ttl pk-yrs)   Types: Cigarettes   Start date: 32   Quit date: 2016   Years since quitting: 8.6  Smokeless Tobacco Never  . She has been exposed to passive smoke. The patient's alcohol use is:  Social History   Substance and Sexual Activity  Alcohol Use Yes   Alcohol/week: 0.0 standard drinks of alcohol    Review of Systems  Constitutional: Negative.   HENT: Negative.    Eyes: Negative.  Negative for blurred vision.  Respiratory: Negative.  Negative for shortness of breath.   Cardiovascular: Negative.  Negative for chest pain and palpitations.  Gastrointestinal: Negative.   Endocrine: Negative.   Genitourinary: Negative.   Musculoskeletal: Negative.   Skin: Negative.   Allergic/Immunologic: Negative.   Neurological: Negative.  Negative for headaches.  Hematological: Negative.   Psychiatric/Behavioral: Negative.       Today's Vitals   08/10/23 1038 08/10/23 1104  BP: (!) 144/82 130/84  Pulse: 64   Temp: 98.3 F (36.8 C)   SpO2: 98%   Weight: 193 lb 3.2 oz (87.6 kg)   Height: 5\' 5"  (1.651 m)    Body mass index is 32.15 kg/m.  Wt Readings from Last 3 Encounters:  08/10/23 193 lb 3.2 oz (87.6 kg)  03/31/23 191 lb 3.2 oz (86.7 kg)  11/26/22 190 lb 9.6 oz (86.5 kg)     Echo: Left Ventricle  Left ventricle size is normal. Wall thickness is normal. Systolic function is normal. EF: 55-60%. Doppler parameters indicate normal diastolic function.   Right Ventricle  Right ventricle size is normal. Systolic function is normal.   Left Atrium  Left atrium size is normal. Left atrium volume index is normal (16-34 mL/m2).   Right Atrium   Right atrium size is normal.   IVC/SVC  The inferior vena cava demonstrates a diameter of <=2.1 cm and collapses >50%; therefore, the right atrial pressure is estimated at 3 mmHg.   Mitral Valve  Mitral valve structure is normal. The leaflets are mildly thickened and exhibit normal excursion. There is mild annular calcification. There is trace regurgitation.   Tricuspid Valve  Tricuspid valve structure is normal. There is trace regurgitation. The right ventricular systolic pressure is normal (<36 mmHg).   Aortic Valve  The aortic valve is tricuspid. The leaflets are mildly thickened and exhibit normal excursion. There is no regurgitation or stenosis.   Pulmonic Valve  The pulmonic valve was not well visualized. Trace regurgitation. There is no evidence of pulmonic valve stenosis.   Ascending Aorta  The aortic root is normal in size. The ascending aorta is normal in size.   Pericardium  There is no pericardial effusion.  Objective:  Physical Exam  Vitals and nursing note reviewed.  Constitutional:      Appearance: Normal appearance.  HENT:     Head: Normocephalic and atraumatic.     Right Ear: Tympanic membrane, ear canal and external ear normal.     Left Ear: Tympanic membrane, ear canal and external ear normal.     Nose: Nose normal.     Mouth/Throat:     Mouth: Mucous membranes are moist.     Pharynx: Oropharynx is clear.  Eyes:     Extraocular Movements: Extraocular movements intact.     Conjunctiva/sclera: Conjunctivae normal.     Pupils: Pupils are equal, round, and reactive to light.  Cardiovascular:     Rate and Rhythm: Normal rate and regular rhythm.     Pulses:          Dorsalis pedis pulses are 2+ on the right side and 2+ on the left side.     Heart sounds: Normal heart sounds.  Pulmonary:     Effort: Pulmonary effort is normal.     Breath sounds: Normal breath sounds.  Chest:  Breasts:    Tanner Score is 5.     Right: Normal.     Left: Normal.   Abdominal:     General: Abdomen is flat. Bowel sounds are normal.     Palpations: Abdomen is soft.  Genitourinary:    Comments: deferred Musculoskeletal:        General: Normal range of motion.     Cervical back: Normal range of motion and neck supple.  Feet:     Right foot:     Protective Sensation: 5 sites tested.  5 sites sensed.     Skin integrity: Callus present.     Toenail Condition: Right toenails are normal.     Left foot:     Protective Sensation: 5 sites tested.  5 sites sensed.     Skin integrity: Dry skin present.     Toenail Condition: Left toenails are normal.  Skin:    General: Skin is warm and dry.  Neurological:     General: No focal deficit present.     Mental Status: She is alert and oriented to person, place, and time.  Psychiatric:        Mood and Affect: Mood normal.        Behavior: Behavior normal.         Assessment And Plan:     Annual physical exam Assessment & Plan: A full exam was performed.  Importance of monthly self breast exams was discussed with the patient.  She is advised to get 3045 minutes of regular exercise, no less than four to five days per week. Both weight-bearing and aerobic exercises are recommended.  She is advised to follow a healthy diet with at least six fruits/veggies per day, decrease intake of red meat and other saturated fats and to increase fish intake to twice weekly.  Meats/fish should not be fried -- baked, boiled or broiled is preferable. It is also important to cut back on your sugar intake.  Be sure to read labels - try to avoid anything with added sugar, high fructose corn syrup or other sweeteners.  If you must use a sweetener, you can try stevia or monkfruit.  It is also important to avoid artificially sweetened foods/beverages and diet drinks. Lastly, wear SPF 50 sunscreen on exposed skin and when in direct sunlight for an extended period of time.  Be sure to avoid fast food restaurants and  aim for at least 60 ounces  of water daily.       Essential hypertension, benign Assessment & Plan: Chronic, initially uncontrolled. Repeat BP 130/84. Goal BP<120/80.  She is encouraged to follow low sodium diet. She will continue with amlodipine 5mg  daily and losartan 100mg  daily. May need to switch to valsartan in the future for improved blood pressure control.  I will re-evaluate at her next visit in 3-4 months.   Orders: -     CMP14+EGFR -     Lipid panel  Type 2 diabetes mellitus with hyperlipidemia (HCC) Assessment & Plan: Chronic, diabetic foot exam was performed.  She is not on any meds, she was reminded of cardiac benefits with use of  GLP-1 agonists. She is not interested in therapy at this time. I DISCUSSED WITH THE PATIENT AT LENGTH REGARDING THE GOALS OF GLYCEMIC CONTROL AND POSSIBLE LONG-TERM COMPLICATIONS.  I  ALSO STRESSED THE IMPORTANCE OF COMPLIANCE WITH HOME GLUCOSE MONITORING, DIETARY RESTRICTIONS INCLUDING AVOIDANCE OF SUGARY DRINKS/PROCESSED FOODS,  ALONG WITH REGULAR EXERCISE.  I  ALSO STRESSED THE IMPORTANCE OF ANNUAL EYE EXAMS, SELF FOOT CARE AND COMPLIANCE WITH OFFICE VISITS.   Orders: -     CBC -     CMP14+EGFR -     Lipid panel -     Hemoglobin A1c -     POCT urinalysis dipstick -     Microalbumin / creatinine urine ratio -     TSH  Gastroesophageal reflux disease without esophagitis Assessment & Plan: Advised to decrease famotidine dosing to once daily. Reminded to avoid known triggers and to stop eating 3 hrs prior to lying down. She will let me know if her symptoms persist.    Class 1 obesity due to excess calories with serious comorbidity and body mass index (BMI) of 32.0 to 32.9 in adult Assessment & Plan: She is encouraged to strive for BMI less than 30 to decrease cardiac risk. Advised to aim for at least 150 minutes of exercise per week.    She is encouraged to strive for BMI less than 30 to decrease cardiac risk. Advised to aim for at least 150 minutes of exercise per  week.    Return for 1 year HM 4 month dm. Patient was given opportunity to ask questions. Patient verbalized understanding of the plan and was able to repeat key elements of the plan. All questions were answered to their satisfaction.     I, [name], MD, have reviewed all documentation for this visit. The documentation on 08/10/23 for the exam, diagnosis, procedures, and orders are all accurate and complete.

## 2023-08-10 NOTE — Assessment & Plan Note (Signed)
Advised to decrease famotidine dosing to once daily. Reminded to avoid known triggers and to stop eating 3 hrs prior to lying down. She will let me know if her symptoms persist.

## 2023-08-10 NOTE — Patient Instructions (Addendum)
Magnesium glycinate, use as directed  Health Maintenance, Female Adopting a healthy lifestyle and getting preventive care are important in promoting health and wellness. Ask your health care provider about: The right schedule for you to have regular tests and exams. Things you can do on your own to prevent diseases and keep yourself healthy. What should I know about diet, weight, and exercise? Eat a healthy diet  Eat a diet that includes plenty of vegetables, fruits, low-fat dairy products, and lean protein. Do not eat a lot of foods that are high in solid fats, added sugars, or sodium. Maintain a healthy weight Body mass index (BMI) is used to identify weight problems. It estimates body fat based on height and weight. Your health care provider can help determine your BMI and help you achieve or maintain a healthy weight. Get regular exercise Get regular exercise. This is one of the most important things you can do for your health. Most adults should: Exercise for at least 150 minutes each week. The exercise should increase your heart rate and make you sweat (moderate-intensity exercise). Do strengthening exercises at least twice a week. This is in addition to the moderate-intensity exercise. Spend less time sitting. Even light physical activity can be beneficial. Watch cholesterol and blood lipids Have your blood tested for lipids and cholesterol at 63 years of age, then have this test every 5 years. Have your cholesterol levels checked more often if: Your lipid or cholesterol levels are high. You are older than 63 years of age. You are at high risk for heart disease. What should I know about cancer screening? Depending on your health history and family history, you may need to have cancer screening at various ages. This may include screening for: Breast cancer. Cervical cancer. Colorectal cancer. Skin cancer. Lung cancer. What should I know about heart disease, diabetes, and high  blood pressure? Blood pressure and heart disease High blood pressure causes heart disease and increases the risk of stroke. This is more likely to develop in people who have high blood pressure readings or are overweight. Have your blood pressure checked: Every 3-5 years if you are 42-9 years of age. Every year if you are 89 years old or older. Diabetes Have regular diabetes screenings. This checks your fasting blood sugar level. Have the screening done: Once every three years after age 38 if you are at a normal weight and have a low risk for diabetes. More often and at a younger age if you are overweight or have a high risk for diabetes. What should I know about preventing infection? Hepatitis B If you have a higher risk for hepatitis B, you should be screened for this virus. Talk with your health care provider to find out if you are at risk for hepatitis B infection. Hepatitis C Testing is recommended for: Everyone born from [birth year] through 1966. Anyone with known risk factors for hepatitis C. Sexually transmitted infections (STIs) Get screened for STIs, including gonorrhea and chlamydia, if: You are sexually active and are younger than 63 years of age. You are older than 63 years of age and your health care provider tells you that you are at risk for this type of infection. Your sexual activity has changed since you were last screened, and you are at increased risk for chlamydia or gonorrhea. Ask your health care provider if you are at risk. Ask your health care provider about whether you are at high risk for HIV. Your health care provider may recommend a  prescription medicine to help prevent HIV infection. If you choose to take medicine to prevent HIV, you should first get tested for HIV. You should then be tested every 3 months for as long as you are taking the medicine. Pregnancy If you are about to stop having your period (premenopausal) and you may become pregnant, seek counseling  before you get pregnant. Take 400 to 800 micrograms (mcg) of folic acid every day if you become pregnant. Ask for birth control (contraception) if you want to prevent pregnancy. Osteoporosis and menopause Osteoporosis is a disease in which the bones lose minerals and strength with aging. This can result in bone fractures. If you are 4 years old or older, or if you are at risk for osteoporosis and fractures, ask your health care provider if you should: Be screened for bone loss. Take a calcium or vitamin D supplement to lower your risk of fractures. Be given hormone replacement therapy (HRT) to treat symptoms of menopause. Follow these instructions at home: Alcohol use Do not drink alcohol if: Your health care provider tells you not to drink. You are pregnant, may be pregnant, or are planning to become pregnant. If you drink alcohol: Limit how much you have to: 0-1 drink a day. Know how much alcohol is in your drink. In the [place] one drink equals one 12 oz bottle of beer (355 mL), one 5 oz glass of wine (148 mL), or one 1 oz glass of hard liquor (44 mL). Lifestyle Do not use any products that contain nicotine or tobacco. These products include cigarettes, chewing tobacco, and vaping devices, such as e-cigarettes. If you need help quitting, ask your health care provider. Do not use street drugs. Do not share needles. Ask your health care provider for help if you need support or information about quitting drugs. General instructions Schedule regular health, dental, and eye exams. Stay current with your vaccines. Tell your health care provider if: You often feel depressed. You have ever been abused or do not feel safe at home. Summary Adopting a healthy lifestyle and getting preventive care are important in promoting health and wellness. Follow your health care provider's instructions about healthy diet, exercising, and getting tested or screened for diseases. Follow your health care  provider's instructions on monitoring your cholesterol and blood pressure. This information is not intended to replace advice given to you by your health care provider. Make sure you discuss any questions you have with your health care provider. Document Revised: 04/28/2021 Document Reviewed: 04/28/2021 Elsevier Patient Education  2024 [employer].

## 2023-08-10 NOTE — Assessment & Plan Note (Signed)
Chronic, initially uncontrolled. Repeat BP 130/84. Goal BP<120/80.  She is encouraged to follow low sodium diet. She will continue with amlodipine 5mg  daily and losartan 100mg  daily. May need to switch to valsartan in the future for improved blood pressure control.  I will re-evaluate at her next visit in 3-4 months.

## 2023-08-10 NOTE — Assessment & Plan Note (Signed)
She is encouraged to strive for BMI less than 30 to decrease cardiac risk. Advised to aim for at least 150 minutes of exercise per week.  

## 2023-08-10 NOTE — Assessment & Plan Note (Signed)

## 2023-08-11 LAB — CBC
Hematocrit: 41.3 % (ref 34.0–46.6)
Hemoglobin: 13.8 g/dL (ref 11.1–15.9)
MCH: 27.5 pg (ref 26.6–33.0)
MCHC: 33.4 g/dL (ref 31.5–35.7)
MCV: 82 fL (ref 79–97)
Platelets: 259 10*3/uL (ref 150–450)
RBC: 5.01 x10E6/uL (ref 3.77–5.28)
RDW: 13.7 % (ref 11.7–15.4)
WBC: 6.2 10*3/uL (ref 3.4–10.8)

## 2023-08-11 LAB — CMP14+EGFR
ALT: 31 IU/L (ref 0–32)
AST: 31 IU/L (ref 0–40)
Albumin: 4.9 g/dL (ref 3.9–4.9)
Alkaline Phosphatase: 130 IU/L — ABNORMAL HIGH (ref 44–121)
BUN/Creatinine Ratio: 14 (ref 12–28)
BUN: 15 mg/dL (ref 8–27)
Bilirubin Total: 0.3 mg/dL (ref 0.0–1.2)
CO2: 25 mmol/L (ref 20–29)
Calcium: 10.1 mg/dL (ref 8.7–10.3)
Chloride: 102 mmol/L (ref 96–106)
Creatinine, Ser: 1.06 mg/dL — ABNORMAL HIGH (ref 0.57–1.00)
Globulin, Total: 2.7 g/dL (ref 1.5–4.5)
Glucose: 102 mg/dL — ABNORMAL HIGH (ref 70–99)
Potassium: 4.4 mmol/L (ref 3.5–5.2)
Sodium: 140 mmol/L (ref 134–144)
Total Protein: 7.6 g/dL (ref 6.0–8.5)
eGFR: 59 mL/min/{1.73_m2} — ABNORMAL LOW (ref >59)

## 2023-08-11 LAB — LIPID PANEL
Chol/HDL Ratio: 2.6 ratio (ref 0.0–4.4)
Cholesterol, Total: 157 mg/dL (ref 100–199)
HDL: 61 mg/dL (ref >39)
LDL Chol Calc (NIH): 75 mg/dL (ref 0–99)
Triglycerides: 121 mg/dL (ref 0–149)
VLDL Cholesterol Cal: 21 mg/dL (ref 5–40)

## 2023-08-11 LAB — HEMOGLOBIN A1C
Est. average glucose Bld gHb Est-mCnc: 126 mg/dL
Hgb A1c MFr Bld: 6 % — ABNORMAL HIGH (ref 4.8–5.6)

## 2023-08-11 LAB — MICROALBUMIN / CREATININE URINE RATIO
Creatinine, Urine: 89.3 mg/dL
Microalb/Creat Ratio: 12 mg/g creat (ref 0–29)
Microalbumin, Urine: 10.6 ug/mL

## 2023-08-11 LAB — TSH: TSH: 1.29 u[IU]/mL (ref 0.450–4.500)

## 2023-09-03 LAB — HM MAMMOGRAPHY

## 2023-09-15 ENCOUNTER — Encounter: Payer: Self-pay | Admitting: Internal Medicine

## 2023-09-23 ENCOUNTER — Other Ambulatory Visit: Payer: Self-pay | Admitting: Internal Medicine

## 2023-11-22 NOTE — Progress Notes (Unsigned)
I,Dell Hurtubise T Deloria Lair, CMA,acting as a Neurosurgeon for [name], MD.,have documented all relevant documentation on the behalf of [name], MD,as directed by  [name], MD while in the presence of [name], MD.  Subjective:  Patient ID: [name] , female    DOB: June 18, 1961 , 62 y.o.   MRN: [id number]  No chief complaint on file.   HPI  HPI   Past Medical History:  Diagnosis Date   Diabetes mellitus    Hyperlipemia    Hypertension    Hypoxemia    Obesity    Vitamin D deficiency      Family History  Problem Relation Age of Onset   Heart disease Mother    Uterine cancer Mother    Cancer Father    Breast cancer Maternal Aunt    Breast cancer Cousin    Cancer Maternal Grandmother      Current Outpatient Medications:    amLODipine (NORVASC) 5 MG tablet, TAKE 1 TABLET(5 MG) BY MOUTH DAILY, Disp: 90 tablet, Rfl: 1   aspirin EC 81 MG tablet, Take 81 mg by mouth daily., Disp: , Rfl:    atorvastatin (LIPITOR) 20 MG tablet, TAKE 1 TABLET BY MOUTH EVERY DAY, Disp: 90 tablet, Rfl: 1   Cholecalciferol (VITAMIN D3) 5000 units CAPS, Take by mouth., Disp: , Rfl:    famotidine (PEPCID) 20 MG tablet, TAKE 1 TABLET(20 MG) BY MOUTH TWICE DAILY, Disp: 180 tablet, Rfl: 0   fluticasone (FLONASE) 50 MCG/ACT nasal spray, SHAKE LIQUID AND USE 1 SPRAY IN EACH NOSTRIL DAILY, Disp: 16 g, Rfl: 2   losartan (COZAAR) 100 MG tablet, TAKE 1 TABLET(100 MG) BY MOUTH DAILY, Disp: 90 tablet, Rfl: 1   Magnesium Oxide POWD, Take by mouth. (Patient not taking: Reported on 11/26/2022), Disp: , Rfl:    nystatin powder, Apply 1 Application topically 2 (two) times daily as needed., Disp: 30 g, Rfl: 1   No Known Allergies   Review of Systems   There were no vitals filed for this visit. There is no height or weight on file to calculate BMI.  Wt Readings from Last 3 Encounters:  08/10/23 193 lb 3.2 oz (87.6 kg)  03/31/23 191 lb 3.2 oz (86.7 kg)  11/26/22 190 lb 9.6 oz (86.5 kg)      Objective:  Physical Exam      Assessment And Plan:  There are no diagnoses linked to this encounter.   No follow-ups on file.  Patient was given opportunity to ask questions. Patient verbalized understanding of the plan and was able to repeat key elements of the plan. All questions were answered to their satisfaction.  [name], MD  I, [name], MD, have reviewed all documentation for this visit. The documentation on 11/22/23 for the exam, diagnosis, procedures, and orders are all accurate and complete.   IF YOU HAVE BEEN REFERRED TO A SPECIALIST, IT MAY TAKE 1-2 WEEKS TO SCHEDULE/PROCESS THE REFERRAL. IF YOU HAVE NOT HEARD FROM US/SPECIALIST IN TWO WEEKS, PLEASE GIVE [place] A CALL AT [phone].   THE PATIENT IS ENCOURAGED TO PRACTICE SOCIAL DISTANCING DUE TO THE COVID-19 PANDEMIC.

## 2023-11-23 ENCOUNTER — Ambulatory Visit: Payer: BC Managed Care – PPO | Admitting: Internal Medicine

## 2023-11-24 ENCOUNTER — Encounter: Payer: Self-pay | Admitting: Internal Medicine

## 2023-11-24 ENCOUNTER — Ambulatory Visit: Payer: BC Managed Care – PPO | Admitting: Internal Medicine

## 2023-11-24 VITALS — BP 128/78 | HR 64 | Temp 98.2°F | Ht 66.0 in | Wt 195.4 lb

## 2023-11-24 DIAGNOSIS — I700 Atherosclerosis of aorta: Secondary | ICD-10-CM | POA: Diagnosis not present

## 2023-11-24 DIAGNOSIS — E1169 Type 2 diabetes mellitus with other specified complication: Secondary | ICD-10-CM | POA: Diagnosis not present

## 2023-11-24 DIAGNOSIS — E6609 Other obesity due to excess calories: Secondary | ICD-10-CM

## 2023-11-24 DIAGNOSIS — G4733 Obstructive sleep apnea (adult) (pediatric): Secondary | ICD-10-CM

## 2023-11-24 DIAGNOSIS — E785 Hyperlipidemia, unspecified: Secondary | ICD-10-CM | POA: Diagnosis not present

## 2023-11-24 DIAGNOSIS — I119 Hypertensive heart disease without heart failure: Secondary | ICD-10-CM | POA: Diagnosis not present

## 2023-11-24 DIAGNOSIS — F17200 Nicotine dependence, unspecified, uncomplicated: Secondary | ICD-10-CM | POA: Insufficient documentation

## 2023-11-24 DIAGNOSIS — R748 Abnormal levels of other serum enzymes: Secondary | ICD-10-CM

## 2023-11-24 DIAGNOSIS — E66811 Obesity, class 1: Secondary | ICD-10-CM

## 2023-11-24 DIAGNOSIS — Z6831 Body mass index (BMI) 31.0-31.9, adult: Secondary | ICD-10-CM

## 2023-11-24 MED ORDER — BUPROPION HCL ER (XL) 150 MG PO TB24: 150.0000 mg | ORAL_TABLET | ORAL | 2 refills | Status: AC

## 2023-11-24 MED ORDER — ATORVASTATIN CALCIUM 20 MG PO TABS: 20.0000 mg | ORAL_TABLET | Freq: Every day | ORAL | 1 refills | Status: AC

## 2023-11-24 MED ORDER — FAMOTIDINE 20 MG PO TABS: 20.0000 mg | ORAL_TABLET | Freq: Two times a day (BID) | ORAL | 0 refills | Status: AC | PRN

## 2023-11-24 MED ORDER — LOSARTAN POTASSIUM 100 MG PO TABS: ORAL_TABLET | ORAL | 1 refills | Status: DC

## 2023-11-24 MED ORDER — AMLODIPINE BESYLATE 5 MG PO TABS: ORAL_TABLET | ORAL | 1 refills | Status: DC

## 2023-11-24 NOTE — Progress Notes (Signed)
I,[name], CMA,acting as a Neurosurgeon for Lori Aliment, MD.,have documented all relevant documentation on the behalf of Lori Aliment, MD,as directed by  Lori Aliment, MD while in the presence of Lori Aliment, MD.  Subjective:  Patient ID: [name] , female    DOB: 1961/03/21 , 62 y.o.   MRN: [id number]  Chief Complaint  Patient presents with   Hypertension   Diabetes    HPI  Patient presents today for a diabetes and bp check. Patient reports compliance with her bp meds. She elected to not resume Ozempic as discussed at her previous visit. She denies headaches, chest pain and shortness of breath.     Diabetes She presents for her follow-up diabetic visit. She has type 2 diabetes mellitus. Her disease course has been stable. There are no hypoglycemic associated symptoms. Pertinent negatives for hypoglycemia include no headaches. There are no diabetic associated symptoms. Pertinent negatives for diabetes include no blurred vision and no chest pain. There are no hypoglycemic complications. There are no diabetic complications. Risk factors for coronary artery disease include diabetes mellitus, dyslipidemia, hypertension, post-menopausal and sedentary lifestyle. She is compliant with treatment most of the time. She is following a diabetic diet. She participates in exercise three times a week. She does not see a podiatrist.Eye exam is current.  Hypertension This is a chronic problem. The current episode started more than 1 year ago. The problem has been gradually improving since onset. The problem is controlled. Pertinent negatives include no blurred vision, chest pain, headaches, palpitations or shortness of breath.     Past Medical History:  Diagnosis Date   Diabetes mellitus    Hyperlipemia    Hypertension    Hypoxemia    Obesity    Vitamin D deficiency      Family History  Problem Relation Age of Onset   Heart disease Mother    Uterine cancer Mother    Cancer  Father    Breast cancer Maternal Aunt    Breast cancer Cousin    Cancer Maternal Grandmother      Current Outpatient Medications:    aspirin EC 81 MG tablet, Take 81 mg by mouth daily., Disp: , Rfl:    buPROPion (WELLBUTRIN XL) 150 MG 24 hr tablet, Take 1 tablet (150 mg total) by mouth every morning., Disp: 30 tablet, Rfl: 2   Cholecalciferol (VITAMIN D3) 5000 units CAPS, Take by mouth., Disp: , Rfl:    fluticasone (FLONASE) 50 MCG/ACT nasal spray, SHAKE LIQUID AND USE 1 SPRAY IN EACH NOSTRIL DAILY, Disp: 16 g, Rfl: 2   nystatin powder, Apply 1 Application topically 2 (two) times daily as needed., Disp: 30 g, Rfl: 1   amLODipine (NORVASC) 5 MG tablet, TAKE 1 TABLET(5 MG) BY MOUTH DAILY, Disp: 90 tablet, Rfl: 1   atorvastatin (LIPITOR) 20 MG tablet, Take 1 tablet (20 mg total) by mouth daily., Disp: 90 tablet, Rfl: 1   famotidine (PEPCID) 20 MG tablet, Take 1 tablet (20 mg total) by mouth 2 (two) times daily as needed for heartburn or indigestion., Disp: 180 tablet, Rfl: 0   losartan (COZAAR) 100 MG tablet, TAKE 1 TABLET(100 MG) BY MOUTH DAILY, Disp: 90 tablet, Rfl: 1   Magnesium Oxide POWD, Take by mouth. (Patient not taking: Reported on 11/26/2022), Disp: , Rfl:    No Known Allergies   Review of Systems  Constitutional: Negative.   Eyes:  Negative for blurred vision.  Respiratory: Negative.  Negative for shortness of breath.  Cardiovascular: Negative.  Negative for chest pain and palpitations.  Gastrointestinal: Negative.   Neurological: Negative.  Negative for headaches.  Psychiatric/Behavioral: Negative.       Today's Vitals   11/24/23 1130  BP: 128/78  Pulse: 64  Temp: 98.2 F (36.8 C)  SpO2: 98%  Weight: 195 lb 6.4 oz (88.6 kg)  Height: 5\' 6"  (1.676 m)   Body mass index is 31.54 kg/m.  Wt Readings from Last 3 Encounters:  11/24/23 195 lb 6.4 oz (88.6 kg)  08/10/23 193 lb 3.2 oz (87.6 kg)  03/31/23 191 lb 3.2 oz (86.7 kg)     Objective:  Physical Exam Vitals and  nursing note reviewed.  Constitutional:      Appearance: Normal appearance.  HENT:     Head: Normocephalic and atraumatic.  Eyes:     Extraocular Movements: Extraocular movements intact.  Cardiovascular:     Rate and Rhythm: Normal rate and regular rhythm.     Heart sounds: Normal heart sounds.  Pulmonary:     Effort: Pulmonary effort is normal.     Breath sounds: Normal breath sounds.  Musculoskeletal:     Cervical back: Normal range of motion.  Skin:    General: Skin is warm.  Neurological:     General: No focal deficit present.     Mental Status: She is alert.  Psychiatric:        Mood and Affect: Mood normal.        Behavior: Behavior normal.         Assessment And Plan:  Hypertensive heart disease without heart failure Assessment & Plan: Chronic, much better control today.  Goal BP<120/80.  She is encouraged to follow low sodium diet. She will continue with amlodipine 5mg  daily and losartan 100mg  daily.   Orders: -     BMP8+EGFR  Aortic atherosclerosis (HCC) Assessment & Plan: Chronic, LDL goal is less than 70.  She is encouraged to take atorvastatin 20mg  daily and to follow a heart healthy lifestyle.    Type 2 diabetes mellitus with hyperlipidemia (HCC) Assessment & Plan: Chronic, she is not on any meds per her request. She was reminded of cardiac benefits with use of  GLP-1 agonists. She is not interested in therapy at this time.  Orders: -     Hemoglobin A1c -     BMP8+EGFR  OSA on CPAP Assessment & Plan: Sleep study ordered by Cardiology, notes reviewed in Care Everywhere. She is recently diagnosed, now on CPAP. She has realized benefit since starting the CPAP. She is encouraged to wear at least four hours per night.    Elevated alkaline phosphatase level Assessment & Plan: Most recent vitamin D level is normal. I will check isoenzymes today.   Orders: -     Alkaline phosphatase, isoenzymes  Tobacco use disorder Assessment & Plan: Smoking  cessation instruction/counseling given:  counseled patient on the dangers of tobacco use, advised patient to stop smoking, and reviewed strategies to maximize success.  She agrees to read about Wellbutrin XL 150mg  daily. Rx sent to pharmacy. If she starts the medication, she agrees to f/u appt 4-6 weeks after starting the medication. She has no h/o seizure disorder. Additionally, she is due for LDCT in January 2025.    Class 1 obesity due to excess calories with serious comorbidity and body mass index (BMI) of 31.0 to 31.9 in adult Assessment & Plan: She is encouraged to strive for BMI less than 30 to decrease cardiac risk. Advised to aim  for at least 150 minutes of exercise per week.    Other orders -     buPROPion HCl ER (XL); Take 1 tablet (150 mg total) by mouth every morning.  Dispense: 30 tablet; Refill: 2 -     amLODIPine Besylate; TAKE 1 TABLET(5 MG) BY MOUTH DAILY  Dispense: 90 tablet; Refill: 1 -     Atorvastatin Calcium; Take 1 tablet (20 mg total) by mouth daily.  Dispense: 90 tablet; Refill: 1 -     Losartan Potassium; TAKE 1 TABLET(100 MG) BY MOUTH DAILY  Dispense: 90 tablet; Refill: 1 -     Famotidine; Take 1 tablet (20 mg total) by mouth 2 (two) times daily as needed for heartburn or indigestion.  Dispense: 180 tablet; Refill: 0  She is encouraged to strive for BMI less than 30 to decrease cardiac risk. Advised to aim for at least 150 minutes of exercise per week.    Return for 4 month bp & dm .  Patient was given opportunity to ask questions. Patient verbalized understanding of the plan and was able to repeat key elements of the plan. All questions were answered to their satisfaction.    I, Lori Aliment, MD, have reviewed all documentation for this visit. The documentation on 11/24/23 for the exam, diagnosis, procedures, and orders are all accurate and complete.   IF YOU HAVE BEEN REFERRED TO A SPECIALIST, IT MAY TAKE 1-2 WEEKS TO SCHEDULE/PROCESS THE REFERRAL. IF YOU  HAVE NOT HEARD FROM US/SPECIALIST IN TWO WEEKS, PLEASE GIVE [place] A CALL AT [phone].   THE PATIENT IS ENCOURAGED TO PRACTICE SOCIAL DISTANCING DUE TO THE COVID-19 PANDEMIC.

## 2023-11-24 NOTE — Assessment & Plan Note (Signed)
 She is encouraged to strive for BMI less than 30 to decrease cardiac risk. Advised to aim for at least 150 minutes of exercise per week.

## 2023-11-24 NOTE — Assessment & Plan Note (Signed)
Chronic, she is not on any meds per her request. She was reminded of cardiac benefits with use of  GLP-1 agonists. She is not interested in therapy at this time.

## 2023-11-24 NOTE — Patient Instructions (Addendum)
Wellbutrin XL, 150mg  once daily - take in the morning  Hypertension, Adult Hypertension is another name for high blood pressure. High blood pressure forces your heart to work harder to pump blood. This can cause problems over time. There are two numbers in a blood pressure reading. There is a top number (systolic) over a bottom number (diastolic). It is best to have a blood pressure that is below 120/80. What are the causes? The cause of this condition is not known. Some other conditions can lead to high blood pressure. What increases the risk? Some lifestyle factors can make you more likely to develop high blood pressure: Smoking. Not getting enough exercise or physical activity. Being overweight. Having too much fat, sugar, calories, or salt (sodium) in your diet. Drinking too much alcohol. Other risk factors include: Having any of these conditions: Heart disease. Diabetes. High cholesterol. Kidney disease. Obstructive sleep apnea. Having a family history of high blood pressure and high cholesterol. Age. The risk increases with age. Stress. What are the signs or symptoms? High blood pressure may not cause symptoms. Very high blood pressure (hypertensive crisis) may cause: Headache. Fast or uneven heartbeats (palpitations). Shortness of breath. Nosebleed. Vomiting or feeling like you may vomit (nauseous). Changes in how you see. Very bad chest pain. Feeling dizzy. Seizures. How is this treated? This condition is treated by making healthy lifestyle changes, such as: Eating healthy foods. Exercising more. Drinking less alcohol. Your doctor may prescribe medicine if lifestyle changes do not help enough and if: Your top number is above 130. Your bottom number is above 80. Your personal target blood pressure may vary. Follow these instructions at home: Eating and drinking  If told, follow the DASH eating plan. To follow this plan: Fill one half of your plate at each meal  with fruits and vegetables. Fill one fourth of your plate at each meal with whole grains. Whole grains include whole-wheat pasta, brown rice, and whole-grain bread. Eat or drink low-fat dairy products, such as skim milk or low-fat yogurt. Fill one fourth of your plate at each meal with low-fat (lean) proteins. Low-fat proteins include fish, chicken without skin, eggs, beans, and tofu. Avoid fatty meat, cured and processed meat, or chicken with skin. Avoid pre-made or processed food. Limit the amount of salt in your diet to less than 1,500 mg each day. Do not drink alcohol if: Your doctor tells you not to drink. You are pregnant, may be pregnant, or are planning to become pregnant. If you drink alcohol: Limit how much you have to: 0-1 drink a day for women. 0-2 drinks a day for men. Know how much alcohol is in your drink. In the [place] one drink equals one 12 oz bottle of beer (355 mL), one 5 oz glass of wine (148 mL), or one 1 oz glass of hard liquor (44 mL). Lifestyle  Work with your doctor to stay at a healthy weight or to lose weight. Ask your doctor what the best weight is for you. Get at least 30 minutes of exercise that causes your heart to beat faster (aerobic exercise) most days of the week. This may include walking, swimming, or biking. Get at least 30 minutes of exercise that strengthens your muscles (resistance exercise) at least 3 days a week. This may include lifting weights or doing Pilates. Do not smoke or use any products that contain nicotine or tobacco. If you need help quitting, ask your doctor. Check your blood pressure at home as told by your  doctor. Keep all follow-up visits. Medicines Take over-the-counter and prescription medicines only as told by your doctor. Follow directions carefully. Do not skip doses of blood pressure medicine. The medicine does not work as well if you skip doses. Skipping doses also puts you at risk for problems. Ask your doctor about side  effects or reactions to medicines that you should watch for. Contact a doctor if: You think you are having a reaction to the medicine you are taking. You have headaches that keep coming back. You feel dizzy. You have swelling in your ankles. You have trouble with your vision. Get help right away if: You get a very bad headache. You start to feel mixed up (confused). You feel weak or numb. You feel faint. You have very bad pain in your: Chest. Belly (abdomen). You vomit more than once. You have trouble breathing. These symptoms may be an emergency. Get help right away. Call 911. Do not wait to see if the symptoms will go away. Do not drive yourself to the hospital. Summary Hypertension is another name for high blood pressure. High blood pressure forces your heart to work harder to pump blood. For most people, a normal blood pressure is less than 120/80. Making healthy choices can help lower blood pressure. If your blood pressure does not get lower with healthy choices, you may need to take medicine. This information is not intended to replace advice given to you by your health care provider. Make sure you discuss any questions you have with your health care provider. Document Revised: 09/25/2021 Document Reviewed: 09/25/2021 Elsevier Patient Education  2024 [employer].

## 2023-11-24 NOTE — Assessment & Plan Note (Signed)
Chronic, LDL goal is less than 70.  She is encouraged to take atorvastatin 20mg  daily and to follow a heart healthy lifestyle.

## 2023-11-24 NOTE — Assessment & Plan Note (Addendum)
Sleep study ordered by Cardiology, notes reviewed in Care Everywhere. She is recently diagnosed, now on CPAP. She has realized benefit since starting the CPAP. She is encouraged to wear at least four hours per night.

## 2023-11-24 NOTE — Assessment & Plan Note (Signed)
Chronic, much better control today.  Goal BP<120/80.  She is encouraged to follow low sodium diet. She will continue with amlodipine 5mg  daily and losartan 100mg  daily.

## 2023-11-24 NOTE — Assessment & Plan Note (Addendum)
Smoking cessation instruction/counseling given:  counseled patient on the dangers of tobacco use, advised patient to stop smoking, and reviewed strategies to maximize success.  She agrees to read about Wellbutrin XL 150mg  daily. Rx sent to pharmacy. If she starts the medication, she agrees to f/u appt 4-6 weeks after starting the medication. She has no h/o seizure disorder. Additionally, she is due for LDCT in January 2025.

## 2023-11-24 NOTE — Assessment & Plan Note (Signed)
Most recent vitamin D level is normal. I will check isoenzymes today.

## 2023-11-30 LAB — HEMOGLOBIN A1C
Est. average glucose Bld gHb Est-mCnc: 128 mg/dL
Hgb A1c MFr Bld: 6.1 % — ABNORMAL HIGH (ref 4.8–5.6)

## 2023-11-30 LAB — BMP8+EGFR
BUN/Creatinine Ratio: 12 (ref 12–28)
BUN: 12 mg/dL (ref 8–27)
CO2: 23 mmol/L (ref 20–29)
Calcium: 9.6 mg/dL (ref 8.7–10.3)
Chloride: 102 mmol/L (ref 96–106)
Creatinine, Ser: 0.98 mg/dL (ref 0.57–1.00)
Glucose: 105 mg/dL — ABNORMAL HIGH (ref 70–99)
Potassium: 4.4 mmol/L (ref 3.5–5.2)
Sodium: 140 mmol/L (ref 134–144)
eGFR: 65 mL/min/{1.73_m2} (ref >59)

## 2023-11-30 LAB — ALKALINE PHOSPHATASE, ISOENZYMES
Alkaline Phosphatase: 133 [IU]/L — ABNORMAL HIGH (ref 44–121)
BONE FRACTION: 31 % (ref 14–68)
INTESTINAL FRAC.: 3 % (ref 0–18)
LIVER FRACTION: 66 % (ref 18–85)

## 2023-12-01 ENCOUNTER — Encounter: Payer: Self-pay | Admitting: Internal Medicine

## 2023-12-20 ENCOUNTER — Other Ambulatory Visit: Payer: Self-pay | Admitting: Internal Medicine

## 2024-04-05 ENCOUNTER — Ambulatory Visit: Payer: BC Managed Care – PPO | Admitting: Internal Medicine

## 2024-04-05 ENCOUNTER — Encounter: Payer: Self-pay | Admitting: Internal Medicine

## 2024-04-05 VITALS — BP 120/82 | HR 86 | Temp 98.7°F | Ht 66.0 in | Wt 191.6 lb

## 2024-04-05 DIAGNOSIS — I700 Atherosclerosis of aorta: Secondary | ICD-10-CM | POA: Diagnosis not present

## 2024-04-05 DIAGNOSIS — R748 Abnormal levels of other serum enzymes: Secondary | ICD-10-CM

## 2024-04-05 DIAGNOSIS — E1169 Type 2 diabetes mellitus with other specified complication: Secondary | ICD-10-CM

## 2024-04-05 DIAGNOSIS — E785 Hyperlipidemia, unspecified: Secondary | ICD-10-CM

## 2024-04-05 DIAGNOSIS — I119 Hypertensive heart disease without heart failure: Secondary | ICD-10-CM

## 2024-04-05 DIAGNOSIS — Z6830 Body mass index (BMI) 30.0-30.9, adult: Secondary | ICD-10-CM

## 2024-04-05 DIAGNOSIS — R300 Dysuria: Secondary | ICD-10-CM | POA: Diagnosis not present

## 2024-04-05 DIAGNOSIS — N898 Other specified noninflammatory disorders of vagina: Secondary | ICD-10-CM

## 2024-04-05 DIAGNOSIS — F17200 Nicotine dependence, unspecified, uncomplicated: Secondary | ICD-10-CM

## 2024-04-05 DIAGNOSIS — E66811 Obesity, class 1: Secondary | ICD-10-CM

## 2024-04-05 DIAGNOSIS — E6609 Other obesity due to excess calories: Secondary | ICD-10-CM

## 2024-04-05 LAB — POCT URINALYSIS DIPSTICK
Bilirubin, UA: NEGATIVE
Blood, UA: NEGATIVE
Glucose, UA: NEGATIVE
Ketones, UA: NEGATIVE
Leukocytes, UA: NEGATIVE
Nitrite, UA: NEGATIVE
Protein, UA: NEGATIVE
Spec Grav, UA: 1.01 (ref 1.010–1.025)
Urobilinogen, UA: 0.2 U/dL
pH, UA: 6 (ref 5.0–8.0)

## 2024-04-05 NOTE — Patient Instructions (Signed)
 Hypertension, Adult Hypertension is another name for high blood pressure. High blood pressure forces your heart to work harder to pump blood. This can cause problems over time. There are two numbers in a blood pressure reading. There is a top number (systolic) over a bottom number (diastolic). It is best to have a blood pressure that is below 120/80. What are the causes? The cause of this condition is not known. Some other conditions can lead to high blood pressure. What increases the risk? Some lifestyle factors can make you more likely to develop high blood pressure: Smoking. Not getting enough exercise or physical activity. Being overweight. Having too much fat, sugar, calories, or salt (sodium) in your diet. Drinking too much alcohol. Other risk factors include: Having any of these conditions: Heart disease. Diabetes. High cholesterol. Kidney disease. Obstructive sleep apnea. Having a family history of high blood pressure and high cholesterol. Age. The risk increases with age. Stress. What are the signs or symptoms? High blood pressure may not cause symptoms. Very high blood pressure (hypertensive crisis) may cause: Headache. Fast or uneven heartbeats (palpitations). Shortness of breath. Nosebleed. Vomiting or feeling like you may vomit (nauseous). Changes in how you see. Very bad chest pain. Feeling dizzy. Seizures. How is this treated? This condition is treated by making healthy lifestyle changes, such as: Eating healthy foods. Exercising more. Drinking less alcohol. Your doctor may prescribe medicine if lifestyle changes do not help enough and if: Your top number is above 130. Your bottom number is above 80. Your personal target blood pressure may vary. Follow these instructions at home: Eating and drinking  If told, follow the DASH eating plan. To follow this plan: Fill one half of your plate at each meal with fruits and vegetables. Fill one fourth of your plate  at each meal with whole grains. Whole grains include whole-wheat pasta, brown rice, and whole-grain bread. Eat or drink low-fat dairy products, such as skim milk or low-fat yogurt. Fill one fourth of your plate at each meal with low-fat (lean) proteins. Low-fat proteins include fish, chicken without skin, eggs, beans, and tofu. Avoid fatty meat, cured and processed meat, or chicken with skin. Avoid pre-made or processed food. Limit the amount of salt in your diet to less than 1,500 mg each day. Do not drink alcohol if: Your doctor tells you not to drink. You are pregnant, may be pregnant, or are planning to become pregnant. If you drink alcohol: Limit how much you have to: 0-1 drink a day for women. 0-2 drinks a day for men. Know how much alcohol is in your drink. In the [place] one drink equals one 12 oz bottle of beer (355 mL), one 5 oz glass of wine (148 mL), or one 1 oz glass of hard liquor (44 mL). Lifestyle  Work with your doctor to stay at a healthy weight or to lose weight. Ask your doctor what the best weight is for you. Get at least 30 minutes of exercise that causes your heart to beat faster (aerobic exercise) most days of the week. This may include walking, swimming, or biking. Get at least 30 minutes of exercise that strengthens your muscles (resistance exercise) at least 3 days a week. This may include lifting weights or doing Pilates. Do not smoke or use any products that contain nicotine or tobacco. If you need help quitting, ask your doctor. Check your blood pressure at home as told by your doctor. Keep all follow-up visits. Medicines Take over-the-counter and prescription medicines  only as told by your doctor. Follow directions carefully. Do not skip doses of blood pressure medicine. The medicine does not work as well if you skip doses. Skipping doses also puts you at risk for problems. Ask your doctor about side effects or reactions to medicines that you should watch  for. Contact a doctor if: You think you are having a reaction to the medicine you are taking. You have headaches that keep coming back. You feel dizzy. You have swelling in your ankles. You have trouble with your vision. Get help right away if: You get a very bad headache. You start to feel mixed up (confused). You feel weak or numb. You feel faint. You have very bad pain in your: Chest. Belly (abdomen). You vomit more than once. You have trouble breathing. These symptoms may be an emergency. Get help right away. Call 911. Do not wait to see if the symptoms will go away. Do not drive yourself to the hospital. Summary Hypertension is another name for high blood pressure. High blood pressure forces your heart to work harder to pump blood. For most people, a normal blood pressure is less than 120/80. Making healthy choices can help lower blood pressure. If your blood pressure does not get lower with healthy choices, you may need to take medicine. This information is not intended to replace advice given to you by your health care provider. Make sure you discuss any questions you have with your health care provider. Document Revised: 09/25/2021 Document Reviewed: 09/25/2021 Elsevier Patient Education  2024 [employer].

## 2024-04-05 NOTE — Progress Notes (Signed)
 I,Victoria T Basil Lim, CMA,acting as a Neurosurgeon for Smiley Dung, MD.,have documented all relevant documentation on the behalf of Smiley Dung, MD,as directed by  Smiley Dung, MD while in the presence of Smiley Dung, MD.  Subjective:  Patient ID: [name] , female    DOB: Dec 18, 1961 , 63 y.o.   MRN: [id number]  Chief Complaint  Patient presents with   Hypertension    Patient presents today for bp & dm follow up. She reports compliance with medication. Denies headache, chest pain & sob.  She recently had to go to urgent care in [place]. She experienced congestion & feeling like she could not breathe. She was given Doxycycline.  Due to her circumstances of driving from far distance, & times needed to be urgently seen. She would like recommendation for a Dr closer to where she lives.     Diabetes    HPI Discussed the use of AI scribe software for clinical note transcription with the patient, who gave verbal consent to proceed.  History of Present Illness [name] "Art Bigness" is a 63 year old female with diabetes and hypertension who presents for follow-up care.  She recently experienced a respiratory infection lasting two to three weeks, characterized by a persistent cough, congestion, sneezing, and nocturnal dyspnea. She sought care at urgent care, where she was prescribed doxycycline, which she completed with effective results. Additionally, she was given a cough syrup that alleviated her symptoms.  She reports itching in the upper part of her vagina, initially suspecting a yeast infection. She used Monistat and an external 'stop itch' cream, but there is no vaginal discharge or yeast infection. She has not been sexually active.  She has diabetes but is not currently monitoring her blood glucose levels at home. No increased urinary frequency.  She owns a stationary bike at home but only uses it twice a week. She has not consumed her usual morning coffee and ginger  today, which she typically prefers over tea.   Diabetes She presents for her follow-up diabetic visit. She has type 2 diabetes mellitus. Her disease course has been stable. There are no hypoglycemic associated symptoms. Pertinent negatives for hypoglycemia include no headaches. There are no diabetic associated symptoms. Pertinent negatives for diabetes include no blurred vision and no chest pain. There are no hypoglycemic complications. There are no diabetic complications. Risk factors for coronary artery disease include diabetes mellitus, dyslipidemia, hypertension, post-menopausal and sedentary lifestyle. She is compliant with treatment most of the time. She is following a diabetic diet. She participates in exercise three times a week. She does not see a podiatrist.Eye exam is current.  Hypertension This is a chronic problem. The current episode started more than 1 year ago. The problem has been gradually improving since onset. The problem is controlled. Pertinent negatives include no blurred vision, chest pain, headaches, palpitations or shortness of breath.     Past Medical History:  Diagnosis Date   Diabetes mellitus    Hyperlipemia    Hypertension    Hypoxemia    Obesity    Vitamin D  deficiency      Family History  Problem Relation Age of Onset   Heart disease Mother    Uterine cancer Mother    Cancer Father    Breast cancer Maternal Aunt    Breast cancer Cousin    Cancer Maternal Grandmother      Current Outpatient Medications:    amLODipine  (NORVASC ) 5 MG tablet, TAKE 1 TABLET(5 MG) BY MOUTH  DAILY, Disp: 90 tablet, Rfl: 1   aspirin  EC 81 MG tablet, Take 81 mg by mouth daily., Disp: , Rfl:    atorvastatin  (LIPITOR) 20 MG tablet, Take 1 tablet (20 mg total) by mouth daily., Disp: 90 tablet, Rfl: 1   Cholecalciferol (VITAMIN D3) 5000 units CAPS, Take by mouth., Disp: , Rfl:    fluconazole  (DIFLUCAN ) 100 MG tablet, Take one tab po today, repeat in 2 days if needed, Disp: 30  tablet, Rfl: 0   losartan  (COZAAR ) 100 MG tablet, TAKE 1 TABLET(100 MG) BY MOUTH DAILY, Disp: 90 tablet, Rfl: 1   nystatin  powder, Apply 1 Application topically 2 (two) times daily as needed., Disp: 30 g, Rfl: 1   buPROPion  (WELLBUTRIN  XL) 150 MG 24 hr tablet, Take 1 tablet (150 mg total) by mouth every morning. (Patient not taking: Reported on 04/05/2024), Disp: 30 tablet, Rfl: 2   famotidine  (PEPCID ) 20 MG tablet, Take 1 tablet (20 mg total) by mouth 2 (two) times daily as needed for heartburn or indigestion. (Patient not taking: Reported on 04/05/2024), Disp: 180 tablet, Rfl: 0   fluticasone  (FLONASE ) 50 MCG/ACT nasal spray, SHAKE LIQUID AND USE 1 SPRAY IN EACH NOSTRIL DAILY (Patient not taking: Reported on 04/05/2024), Disp: 16 g, Rfl: 2   Magnesium Oxide POWD, Take by mouth. (Patient not taking: Reported on 04/05/2024), Disp: , Rfl:    No Known Allergies   Review of Systems  Constitutional: Negative.   Eyes:  Negative for blurred vision.  Respiratory: Negative.  Negative for shortness of breath.   Cardiovascular: Negative.  Negative for chest pain and palpitations.  Gastrointestinal: Negative.   Genitourinary:  Positive for dysuria.  Neurological: Negative.  Negative for headaches.  Psychiatric/Behavioral: Negative.       Today's Vitals   04/05/24 1103  BP: 120/82  Pulse: 86  Temp: 98.7 F (37.1 C)  SpO2: 98%  Weight: 191 lb 9.6 oz (86.9 kg)  Height: 5\' 6"  (1.676 m)   Body mass index is 30.93 kg/m.  Wt Readings from Last 3 Encounters:  04/05/24 191 lb 9.6 oz (86.9 kg)  11/24/23 195 lb 6.4 oz (88.6 kg)  08/10/23 193 lb 3.2 oz (87.6 kg)     Objective:  Physical Exam Vitals and nursing note reviewed.  Constitutional:      Appearance: Normal appearance.  HENT:     Head: Normocephalic and atraumatic.  Eyes:     Extraocular Movements: Extraocular movements intact.  Cardiovascular:     Rate and Rhythm: Normal rate and regular rhythm.     Heart sounds: Normal heart sounds.   Pulmonary:     Effort: Pulmonary effort is normal.     Breath sounds: Normal breath sounds.  Musculoskeletal:     Cervical back: Normal range of motion.  Skin:    General: Skin is warm.  Neurological:     General: No focal deficit present.     Mental Status: She is alert.  Psychiatric:        Mood and Affect: Mood normal.        Behavior: Behavior normal.         Assessment And Plan:  Hypertensive heart disease without heart failure Assessment & Plan: Chronic, fair control. Goal BP<120/80.  She will continue with amlodipine  5mg  daily and losartan  100mg  daily. Encouraged to follow low sodium diet.   Orders: -     CMP14+EGFR -     Lipid panel  Aortic atherosclerosis (HCC) Assessment & Plan: Chronic, LDL goal is  less than 70.  She is encouraged to take atorvastatin  20mg  daily and to follow a heart healthy lifestyle.    Type 2 diabetes mellitus with hyperlipidemia (HCC) Assessment & Plan: Chronic, she is not on any meds per her request. She was reminded of cardiac benefits with use of  GLP-1 agonists. She is not interested in therapy at this time. Ldl goal is less than 70, c/w statin therapy.  She admits she is not monitoring blood glucose at home. Emphasized importance of bi-weekly checks to manage diabetes and identify symptoms related to blood sugar. Explained potential insulin need if pancreatic function declines. - Provide blood glucose meter for home monitoring. - Instruct to check blood glucose at least twice a week. - She does not wish to take any meds at this time.   Orders: -     CMP14+EGFR -     Lipid panel -     Hemoglobin A1c  Elevated alkaline phosphatase level -     VITAMIN D  25 Hydroxy (Vit-D Deficiency, Fractures)  Dysuria Assessment & Plan: Intermittent dark urine and dysuria. Further investigation needed. - Perform urinalysis today.  Orders: -     POCT urinalysis dipstick  Vaginal itching Assessment & Plan: Persistent itching without relief  from Monistat or anti-itch cream. No discharge or yeast infection signs. Vaginal dryness may contribute.  Sx suggestive of atrophic vaginitis since Monistat was not effective. Will send fluconazole  to take x 1 day. If sx persist, will need Gyn eval to determine cause of her sx.    Tobacco use disorder -     CT CHEST LUNG CANCER SCREENING LOW DOSE WO CONTRAST; Future  Class 1 obesity due to excess calories with serious comorbidity and body mass index (BMI) of 30.0 to 30.9 in adult Assessment & Plan: BMI is acceptable for her demographic. She is encouraged to aim for at least 150 minutes of exercise per week.    Other orders -     Fluconazole ; Take one tab po today, repeat in 2 days if needed  Dispense: 30 tablet; Refill: 0   Return if symptoms worsen or fail to improve.  Patient was given opportunity to ask questions. Patient verbalized understanding of the plan and was able to repeat key elements of the plan. All questions were answered to their satisfaction.   I, Smiley Dung, MD, have reviewed all documentation for this visit. The documentation on 04/05/24 for the exam, diagnosis, procedures, and orders are all accurate and complete.   IF YOU HAVE BEEN REFERRED TO A SPECIALIST, IT MAY TAKE 1-2 WEEKS TO SCHEDULE/PROCESS THE REFERRAL. IF YOU HAVE NOT HEARD FROM US /SPECIALIST IN TWO WEEKS, PLEASE GIVE US  A CALL AT [phone].   THE PATIENT IS ENCOURAGED TO PRACTICE SOCIAL DISTANCING DUE TO THE COVID-19 PANDEMIC.

## 2024-04-06 ENCOUNTER — Encounter: Payer: Self-pay | Admitting: Internal Medicine

## 2024-04-06 LAB — CMP14+EGFR
ALT: 28 IU/L (ref 0–32)
AST: 28 IU/L (ref 0–40)
Albumin: 4.8 g/dL (ref 3.9–4.9)
Alkaline Phosphatase: 129 IU/L — ABNORMAL HIGH (ref 44–121)
BUN/Creatinine Ratio: 14 (ref 12–28)
BUN: 13 mg/dL (ref 8–27)
Bilirubin Total: 0.4 mg/dL (ref 0.0–1.2)
CO2: 25 mmol/L (ref 20–29)
Calcium: 9.7 mg/dL (ref 8.7–10.3)
Chloride: 103 mmol/L (ref 96–106)
Creatinine, Ser: 0.91 mg/dL (ref 0.57–1.00)
Globulin, Total: 2.4 g/dL (ref 1.5–4.5)
Glucose: 90 mg/dL (ref 70–99)
Potassium: 4.7 mmol/L (ref 3.5–5.2)
Sodium: 142 mmol/L (ref 134–144)
Total Protein: 7.2 g/dL (ref 6.0–8.5)
eGFR: 71 mL/min/{1.73_m2} (ref >59)

## 2024-04-06 LAB — LIPID PANEL
Chol/HDL Ratio: 2.9 ratio (ref 0.0–4.4)
Cholesterol, Total: 168 mg/dL (ref 100–199)
HDL: 57 mg/dL (ref >39)
LDL Chol Calc (NIH): 82 mg/dL (ref 0–99)
Triglycerides: 171 mg/dL — ABNORMAL HIGH (ref 0–149)
VLDL Cholesterol Cal: 29 mg/dL (ref 5–40)

## 2024-04-06 LAB — VITAMIN D 25 HYDROXY (VIT D DEFICIENCY, FRACTURES): Vit D, 25-Hydroxy: 53.9 ng/mL (ref 30.0–100.0)

## 2024-04-06 LAB — HEMOGLOBIN A1C
Est. average glucose Bld gHb Est-mCnc: 126 mg/dL
Hgb A1c MFr Bld: 6 % — ABNORMAL HIGH (ref 4.8–5.6)

## 2024-04-09 DIAGNOSIS — N898 Other specified noninflammatory disorders of vagina: Secondary | ICD-10-CM | POA: Insufficient documentation

## 2024-04-09 DIAGNOSIS — R300 Dysuria: Secondary | ICD-10-CM | POA: Insufficient documentation

## 2024-04-09 MED ORDER — FLUCONAZOLE 100 MG PO TABS: ORAL_TABLET | ORAL | 0 refills | Status: AC

## 2024-04-09 NOTE — Assessment & Plan Note (Signed)
 Chronic, LDL goal is less than 70.  She is encouraged to take atorvastatin 20mg  daily and to follow a heart healthy lifestyle.

## 2024-04-09 NOTE — Assessment & Plan Note (Signed)
 Chronic, fair control. Goal BP<120/80.  She will continue with amlodipine  5mg  daily and losartan  100mg  daily. Encouraged to follow low sodium diet.

## 2024-04-09 NOTE — Assessment & Plan Note (Signed)
 Persistent itching without relief from Monistat or anti-itch cream. No discharge or yeast infection signs. Vaginal dryness may contribute.  Sx suggestive of atrophic vaginitis since Monistat was not effective. Will send fluconazole  to take x 1 day. If sx persist, will need Gyn eval to determine cause of her sx.

## 2024-04-09 NOTE — Assessment & Plan Note (Signed)
BMI is acceptable for her demographic. She is encouraged to aim for at least 150 minutes of exercise per week.

## 2024-04-09 NOTE — Assessment & Plan Note (Signed)
 Chronic, she is not on any meds per her request. She was reminded of cardiac benefits with use of  GLP-1 agonists. She is not interested in therapy at this time. Ldl goal is less than 70, c/w statin therapy.  She admits she is not monitoring blood glucose at home. Emphasized importance of bi-weekly checks to manage diabetes and identify symptoms related to blood sugar. Explained potential insulin need if pancreatic function declines. - Provide blood glucose meter for home monitoring. - Instruct to check blood glucose at least twice a week. - She does not wish to take any meds at this time.

## 2024-04-09 NOTE — Assessment & Plan Note (Signed)
 Intermittent dark urine and dysuria. Further investigation needed. - Perform urinalysis today.

## 2024-04-12 ENCOUNTER — Encounter: Payer: Self-pay | Admitting: Internal Medicine

## 2024-06-16 ENCOUNTER — Other Ambulatory Visit: Payer: Self-pay | Admitting: Internal Medicine

## 2024-06-20 ENCOUNTER — Other Ambulatory Visit: Payer: Self-pay | Admitting: Internal Medicine

## 2024-08-10 ENCOUNTER — Encounter: Payer: Self-pay | Admitting: Internal Medicine

## 2024-09-15 ENCOUNTER — Other Ambulatory Visit: Payer: Self-pay | Admitting: Internal Medicine

## 2024-11-05 ENCOUNTER — Other Ambulatory Visit: Payer: Self-pay | Admitting: Internal Medicine

## 2024-12-20 ENCOUNTER — Other Ambulatory Visit: Payer: Self-pay | Admitting: Internal Medicine
